# Patient Record
Sex: Female | Born: 1995 | Race: Black or African American | Hispanic: No | Marital: Single | Smoking: Never smoker
Health system: Southern US, Community
[De-identification: ages and names within clinical notes are randomized; demographics above are authoritative.]

---
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
## 2019-04-06 ENCOUNTER — Encounter (HOSPITAL_COMMUNITY): Payer: Self-pay | Admitting: *Deleted

## 2019-04-06 ENCOUNTER — Other Ambulatory Visit: Payer: Self-pay

## 2019-04-06 ENCOUNTER — Emergency Department (HOSPITAL_COMMUNITY)
Admission: EM | Admit: 2019-04-06 | Discharge: 2019-04-06 | Disposition: A | Discharge status: Home / Self Care | Payer: Self-pay | Attending: Emergency Medicine | Admitting: Emergency Medicine

## 2019-04-06 DIAGNOSIS — Z4801 Encounter for change or removal of surgical wound dressing: Secondary | ICD-10-CM | POA: Insufficient documentation

## 2019-04-06 DIAGNOSIS — Z4800 Encounter for change or removal of nonsurgical wound dressing: Secondary | ICD-10-CM

## 2019-04-06 DIAGNOSIS — Z5189 Encounter for other specified aftercare: Secondary | ICD-10-CM

## 2019-04-06 DIAGNOSIS — N764 Abscess of vulva: Secondary | ICD-10-CM | POA: Insufficient documentation

## 2019-04-06 NOTE — ED Triage Notes (Signed)
Pt in requesting L sided suprapubic abcess packing removed and dressing placed, per pt the packing was placed 04/04/19, denies fever, pt ambulatory, A&O x4

## 2019-04-06 NOTE — ED Provider Notes (Signed)
[facility] EMERGENCY DEPARTMENT Provider Note   CSN: [id number] Arrival date & time: 04/06/19  1323    History   Chief Complaint Chief Complaint  Patient presents with  . Dressing Change    HPI Dannisha Threadgill is a 23 y.o. female.     HPI   23 year old female presents emergency department today for wound check and packing removal.  States she was in [place] last week and had left labial abscess incised and drained.  States she had packing placed.  This occurred on 04/04/19.  At that time she was also having fevers.  She has been on clindamycin.  States that symptoms have improved.  She is no longer having fevers.  The drainage has slowed.  The swelling has also improved.  History reviewed. No pertinent past medical history.  Patient Active Problem List   Diagnosis Date Noted  . ECZEMA, ATOPIC DERMATITIS 01/29/2007    History reviewed. No pertinent surgical history.   OB History   No obstetric history on file.      Home Medications    Prior to Admission medications   Not on File    Family History No family history on file.  Social History Social History   Tobacco Use  . Smoking status: Never Smoker  . Smokeless tobacco: Never Used  Substance Use Topics  . Alcohol use: Not Currently  . Drug use: Not Currently     Allergies   Patient has no known allergies.   Review of Systems Review of Systems  Constitutional: Negative for fever.  Genitourinary:       Left labial abscess  Skin: Positive for wound.     Physical Exam Updated Vital Signs BP 115/74 (BP Location: Right Arm)   Pulse 88   Temp 98.4 F (36.9 C) (Oral)   Resp 14   Ht 5\' 4"  (1.626 m)   Wt 120.2 kg   SpO2 100%   BMI 45.49 kg/m   Physical Exam Vitals signs and nursing note reviewed.  Constitutional:      General: She is not in acute distress.    Appearance: She is well-developed.  HENT:     Head: Normocephalic and atraumatic.  Eyes:     Conjunctiva/sclera:  Conjunctivae normal.  Neck:     Musculoskeletal: Neck supple.  Cardiovascular:     Rate and Rhythm: Normal rate.  Pulmonary:     Effort: Pulmonary effort is normal.  Genitourinary:    Comments: Chaperone present during GU exam. 2x3cm area of induration to the left upper labia. Mildly ttp. Wound packing place.  Musculoskeletal: Normal range of motion.  Skin:    General: Skin is warm and dry.  Neurological:     Mental Status: She is alert.      ED Treatments / Results  Labs (all labs ordered are listed, but only abnormal results are displayed) Labs Reviewed - No data to display  EKG None  Radiology No results found.  Procedures .Foreign Body Removal Date/Time: 04/06/2019 1:58 PM Performed by: Karrie Meres, PA-C Authorized by: Karrie Meres, PA-C  Consent: Verbal consent obtained. Consent given by: patient Patient understanding: patient states understanding of the procedure being performed Patient consent: the patient's understanding of the procedure matches consent given Procedure consent: procedure consent matches procedure scheduled Imaging studies: imaging studies available Patient identity confirmed: verbally with patient Body area: vagina (left labia)  Sedation: Patient sedated: no  Patient restrained: no Localization method: visualized Removal mechanism: digital extraction Complexity: simple  1 objects recovered. Objects recovered: iodoform gauze Post-procedure assessment: foreign body removed Patient tolerance: Patient tolerated the procedure well with no immediate complications   (including critical care time)  Medications Ordered in ED Medications - No data to display   Initial Impression / Assessment and Plan / ED Course  I have reviewed the triage vital signs and the nursing notes.  Pertinent labs & imaging results that were available during my care of the patient were reviewed by me and considered in my medical decision making (see chart  for details).        Final Clinical Impressions(s) / ED Diagnoses   Final diagnoses:  Visit for wound check  Abscess packing removal   Pt with abscess seen two days ago with I&D. This occurred in [place] and chart not available for review. Pt has been doing a very good job of taking care of the site at home and has been taking medications as directed. The site is healthy appearing, with only a small amount of purulent drainage around the skin but incision is open and without significant drainage. Mild surrounding cellulitis and no palpable fluctuance indicating deeper infection or residual abscess. Pt has minimal tenderness in the area. Discussed home care and return precautions, pt advised to continue all antibiotics until they are gone. Will give gen surg referral as she notes recurrent abscesses that have required surgical drainage in the past and just moved to the area so she is not established here. Will give pcp f/u as well. advised to return to the ED for new or worsening sxs in the meantime. She voices understanding and is in agreement with the plan. All questions answered and pt stable for discharge.    ED Discharge Orders    None       Rayne DuCouture, Raesean Bartoletti S, PA-C 04/06/19 1404    Gwyneth SproutPlunkett, Whitney, MD 04/06/19 1553

## 2019-04-06 NOTE — Discharge Instructions (Signed)
Continue taking your antibiotic until the course is finished.   Please complete warm soaks in the tub 2-3 times per day.  Please follow up with your primary care provider within 5-7 days for re-evaluation of your symptoms. If you do not have a primary care provider, information for a healthcare clinic has been provided for you to make arrangements for follow up care. Please return to the emergency department for any new or worsening symptoms.

## 2020-03-11 ENCOUNTER — Encounter (HOSPITAL_COMMUNITY): Payer: Self-pay | Admitting: Emergency Medicine

## 2020-03-11 ENCOUNTER — Emergency Department (HOSPITAL_COMMUNITY)
Admission: EM | Admit: 2020-03-11 | Discharge: 2020-03-11 | Disposition: A | Discharge status: Home / Self Care | Payer: Self-pay | Attending: Emergency Medicine | Admitting: Emergency Medicine

## 2020-03-11 ENCOUNTER — Other Ambulatory Visit: Payer: Self-pay

## 2020-03-11 DIAGNOSIS — B373 Candidiasis of vulva and vagina: Secondary | ICD-10-CM | POA: Insufficient documentation

## 2020-03-11 DIAGNOSIS — A599 Trichomoniasis, unspecified: Secondary | ICD-10-CM | POA: Insufficient documentation

## 2020-03-11 DIAGNOSIS — Z202 Contact with and (suspected) exposure to infections with a predominantly sexual mode of transmission: Secondary | ICD-10-CM | POA: Insufficient documentation

## 2020-03-11 DIAGNOSIS — B3731 Acute candidiasis of vulva and vagina: Secondary | ICD-10-CM

## 2020-03-11 LAB — HIV ANTIBODY (ROUTINE TESTING W REFLEX): HIV Screen 4th Generation wRfx: NONREACTIVE

## 2020-03-11 LAB — URINALYSIS, ROUTINE W REFLEX MICROSCOPIC
Bilirubin Urine: NEGATIVE
Glucose, UA: NEGATIVE mg/dL
Ketones, ur: 20 mg/dL — AB
Nitrite: NEGATIVE
Protein, ur: 30 mg/dL — AB
RBC / HPF: >50 RBC/hpf — ABNORMAL HIGH (ref 0–5)
Specific Gravity, Urine: 1.025 (ref 1.005–1.030)
pH: 6 (ref 5.0–8.0)

## 2020-03-11 LAB — WET PREP, GENITAL
Clue Cells Wet Prep HPF POC: NONE SEEN
Sperm: NONE SEEN

## 2020-03-11 LAB — I-STAT BETA HCG BLOOD, ED (MC, WL, AP ONLY): I-stat hCG, quantitative: <5 m[IU]/mL (ref <5)

## 2020-03-11 MED ORDER — FLUCONAZOLE 150 MG PO TABS
150.0000 mg | ORAL_TABLET | Freq: Once | ORAL | Status: AC
  Administered 2020-03-11: 150 mg via ORAL
  Filled 2020-03-11: qty 1

## 2020-03-11 MED ORDER — CEFTRIAXONE SODIUM 500 MG IJ SOLR
500.0000 mg | Freq: Once | INTRAMUSCULAR | Status: AC
  Administered 2020-03-11: 500 mg via INTRAMUSCULAR
  Filled 2020-03-11: qty 500

## 2020-03-11 MED ORDER — METRONIDAZOLE 500 MG PO TABS
500.0000 mg | ORAL_TABLET | Freq: Once | ORAL | Status: AC
  Administered 2020-03-11: 500 mg via ORAL
  Filled 2020-03-11: qty 1

## 2020-03-11 MED ORDER — DOXYCYCLINE HYCLATE 100 MG PO TABS
100.0000 mg | ORAL_TABLET | Freq: Once | ORAL | Status: AC
  Administered 2020-03-11: 21:00:00 100 mg via ORAL
  Filled 2020-03-11: qty 1

## 2020-03-11 MED ORDER — DOXYCYCLINE HYCLATE 100 MG PO CAPS: 100.0000 mg | ORAL_CAPSULE | Freq: Two times a day (BID) | ORAL | 0 refills | Status: AC

## 2020-03-11 MED ORDER — METRONIDAZOLE 500 MG PO TABS: 500.0000 mg | ORAL_TABLET | Freq: Two times a day (BID) | ORAL | 0 refills | Status: AC

## 2020-03-11 NOTE — ED Triage Notes (Signed)
C/o vaginal discharge and vaginal discomfort x 2-3 days.

## 2020-03-11 NOTE — ED Notes (Signed)
Pelvic cart at bedside. 

## 2020-03-11 NOTE — ED Notes (Signed)
Patient verbalizes understanding of discharge instructions. Opportunity for questioning and answers were provided. Armband removed by staff, pt discharged from ED. Pt. ambulatory and discharged home.  

## 2020-03-11 NOTE — ED Provider Notes (Signed)
[facility] EMERGENCY DEPARTMENT Provider Note   CSN: [id number] Arrival date & time: 03/11/20  1714     History Chief Complaint  Patient presents with   Vaginal Discharge    Katelyn Parker is a 24 y.o. female history of asthma and obesity.  Patient presents today for vaginal discharge and discomfort x3 days.  She reports yellow discharge intermittently beginning 3 days ago, unchanged in amount since onset.  She describes discomfort as a itching sensation to her vagina mild constant nonradiating no clear aggravating or alleviating factors.  She reports history of similar in the past when she was diagnosed with bacterial vaginosis.  She reports that she is sexually active and only uses protection intermittently.  She would like testing and treatment for STDs today.  Denies fever/chills, chest pain, cough/shortness of breath, abdominal pain, nausea/vomiting, dysuria/hematuria, rash/lesions, diarrhea or additional concerns.  HPI     History reviewed. No pertinent past medical history.  Patient Active Problem List   Diagnosis Date Noted   ECZEMA, ATOPIC DERMATITIS 01/29/2007    History reviewed. No pertinent surgical history.   OB History   No obstetric history on file.     No family history on file.  Social History   Tobacco Use   Smoking status: Never Smoker   Smokeless tobacco: Never Used  Substance Use Topics   Alcohol use: Not Currently   Drug use: Not Currently    Home Medications Prior to Admission medications   Medication Sig Start Date End Date Taking? Authorizing Provider  doxycycline (VIBRAMYCIN) 100 MG capsule Take 1 capsule (100 mg total) by mouth 2 (two) times daily for 7 days. 03/11/20 03/18/20  Harlene Salts A, PA-C  metroNIDAZOLE (FLAGYL) 500 MG tablet Take 1 tablet (500 mg total) by mouth 2 (two) times daily for 7 days. 03/11/20 03/18/20  Bill Salinas, PA-C    Allergies    Patient has no known allergies.  Review of  Systems   Review of Systems Ten systems are reviewed and are negative for acute change except as noted in the HPI  Physical Exam Updated Vital Signs BP 129/90 (BP Location: Right Arm)    Pulse 73    Temp 98.3 F (36.8 C) (Oral)    Resp 14    Ht 5' 3.75" (1.619 m)    Wt 120.7 kg    SpO2 98%    BMI 46.02 kg/m   Physical Exam Constitutional:      General: She is not in acute distress.    Appearance: Normal appearance. She is well-developed. She is obese. She is not ill-appearing or diaphoretic.  HENT:     Head: Normocephalic and atraumatic.     Right Ear: External ear normal.     Left Ear: External ear normal.     Nose: Nose normal.  Eyes:     General: Vision grossly intact. Gaze aligned appropriately.     Pupils: Pupils are equal, round, and reactive to light.  Neck:     Trachea: Trachea and phonation normal. No tracheal deviation.  Pulmonary:     Effort: Pulmonary effort is normal. No respiratory distress.  Abdominal:     General: There is no distension.     Palpations: Abdomen is soft.     Tenderness: There is no abdominal tenderness. There is no guarding or rebound.  Genitourinary:    Comments: Exam chaperoned by Ricard Dillon.  Pelvic exam: normal external genitalia without evidence of trauma. VULVA: normal appearing vulva with no  masses, tenderness or lesion. VAGINA: normal appearing vagina with normal color and discharge, no lesions. CERVIX: normal appearing cervix without lesions, cervical motion tenderness absent, cervical os closed without purulent discharge; vaginal discharge yellow. Wet prep and DNA probe for chlamydia and GC obtained.  ADNEXA: normal adnexa in size, nontender and no masses UTERUS: uterus is normal size, shape, consistency and nontender.  Musculoskeletal:        General: Normal range of motion.     Cervical back: Normal range of motion.  Skin:    General: Skin is warm and dry.  Neurological:     Mental Status: She is alert.     GCS: GCS eye  subscore is 4. GCS verbal subscore is 5. GCS motor subscore is 6.     Comments: Speech is clear and goal oriented, follows commands Major Cranial nerves without deficit, no facial droop Moves extremities without ataxia, coordination intact  Psychiatric:        Behavior: Behavior normal.     ED Results / Procedures / Treatments   Labs (all labs ordered are listed, but only abnormal results are displayed) Labs Reviewed  WET PREP, GENITAL - Abnormal; Notable for the following components:      Result Value   Yeast Wet Prep HPF POC PRESENT (*)    Trich, Wet Prep PRESENT (*)    WBC, Wet Prep HPF POC MANY (*)    All other components within normal limits  URINALYSIS, ROUTINE W REFLEX MICROSCOPIC - Abnormal; Notable for the following components:   APPearance CLOUDY (*)    Hgb urine dipstick MODERATE (*)    Ketones, ur 20 (*)    Protein, ur 30 (*)    Leukocytes,Ua LARGE (*)    RBC / HPF >50 (*)    Bacteria, UA RARE (*)    All other components within normal limits  HIV ANTIBODY (ROUTINE TESTING W REFLEX)  RPR  I-STAT BETA HCG BLOOD, ED ([facility], [facility], [facility] ONLY)  GC/CHLAMYDIA PROBE AMP ([place]) NOT AT [facility]    EKG None  Radiology No results found.  Procedures Procedures (including critical care time)  Medications Ordered in ED Medications  cefTRIAXone (ROCEPHIN) injection 500 mg (500 mg Intramuscular Given 03/11/20 2049)  metroNIDAZOLE (FLAGYL) tablet 500 mg (500 mg Oral Given 03/11/20 2049)  doxycycline (VIBRA-TABS) tablet 100 mg (100 mg Oral Given 03/11/20 2049)  fluconazole (DIFLUCAN) tablet 150 mg (150 mg Oral Given 03/11/20 2049)    ED Course  I have reviewed the triage vital signs and the nursing notes.  Pertinent labs & imaging results that were available during my care of the patient were reviewed by me and considered in my medical decision making (see chart for details).  Clinical Course as of Mar 12 20  Sat Mar 11, 2020  2003 Elizabeth RN   [BM]    Clinical Course  User Index [BM] Gari Crown   MDM Rules/Calculators/A&P                      24 year old female presented today for concern of vaginal discharge and STD.  She reports that she is sexually active and only intermittently uses protection.  She denies any systemic symptoms such as fever and has no pain or additional concerns today.  Pelvic examination shows yellow vaginal discharge, no tenderness on exam, and no cervical motion tenderness.  I ordered reviewed and interpreted the following labs.  Negative pregnancy test.  Urinalysis with ketones, protein, large leukocytes,  50 RBCs, 21-50 WBCs, rare bacteria, 6/10 squamous cells.  Urinalysis is grossly contaminated and patient appears dehydrated.  Patient has no urinary symptoms doubt UTI suspect abnormalities secondary to dirty catch and STI.  Additionally patient has no systemic symptoms or history of diabetes to suggest emergent pathologies such as DKA requiring blood work today.  Wet prep shows yeast, trichomonas and WBCs.  HIV nonreactive.  GC chlamydia and RPR are pending. - Based on patient's presenting symptoms, exam and laboratory work-up above concern for likely STI gonorrhea and chlamydia.  Patient is requesting treatment, she will be treated empirically with 500 mg IM Rocephin and 1 week of doxycycline 100 mg twice daily.  Additionally will start patient on Flagyl 500 mg twice daily x7 days for treatment of trichomoniasis.  Patient given 150 mg fluconazole for treatment of vaginal yeast infection.  There is no evidence of PID, ovarian torsion, ectopic pregnancy or other emergent pathologies at this time requiring further work-up in the ER. Patient has been advised to not drink alcohol while on this medication. Patient to be discharged with instructions to follow up with OBGYN/PCP. Discussed importance of using protection when sexually active.  Patient to abstain from sexual activity for at least 2 weeks and then to use protection  thereafter with every sexual encounter.  At this time there does not appear to be any evidence of an acute emergency medical condition and the patient appears stable for discharge with appropriate outpatient follow up. Diagnosis was discussed with patient who verbalizes understanding of care plan and is agreeable to discharge. I have discussed return precautions with patient who verbalizes understanding of return precautions. Patient encouraged to follow-up with their PCP. All questions answered.   Note: Portions of this report may have been transcribed using voice recognition software. Every effort was made to ensure accuracy; however, inadvertent computerized transcription errors may still be present. Final Clinical Impression(s) / ED Diagnoses Final diagnoses:  Vaginal yeast infection  Trichomoniasis  Exposure to sexually transmitted disease (STD)    Rx / DC Orders ED Discharge Orders         Ordered    doxycycline (VIBRAMYCIN) 100 MG capsule  2 times daily     03/11/20 2135    metroNIDAZOLE (FLAGYL) 500 MG tablet  2 times daily     03/11/20 2135           Elizabeth Palau 03/12/20 0027    Charlynne Pander, MD 03/16/20 1615

## 2020-03-11 NOTE — Discharge Instructions (Signed)
You have been diagnosed today with exposure to STD, vaginal yeast infection, trichomoniasis.  At this time there does not appear to be the presence of an emergent medical condition, however there is always the potential for conditions to change. Please read and follow the below instructions.  Please return to the Emergency Department immediately for any new or worsening symptoms. Please be sure to follow up with your Primary Care Provider within one week regarding your visit today; please call their office to schedule an appointment even if you are feeling better for a follow-up visit. You have been started on treatment for gonorrhea, chlamydia, vaginal yeast infection and trichomoniasis.  Please continue taking the medications doxycycline and Flagyl as prescribed until complete for treatment of infection.  Do not drink alcohol while taking Flagyl as it will make you sick and vomit.  You have been treated presumptively today for gonorrhea and chlamydia.  You have been tested today for gonorrhea and chlamydia as well as HIV and syphilis. These results will be available in approximately 3 days. You may check your MyChart account for results. Please inform all sexual partners of positive results and that they should be tested and treated as well. Please wait 2 weeks and be sure that you and your partners are symptom free before returning to sexual activity. Please use protection with every sexual encounter. Follow Up: Please followup with your primary doctor in 3 days for discussion of your diagnoses and further evaluation after today's visit; if you do not have a primary care doctor use the resource guide provided to find one; Please return to the ER for worsening symptoms, high fevers or persistent vomiting.  Get Help Right Away If: You have a fever. Your symptoms go away and then return. Your symptoms do not get better with treatment. Your symptoms get worse. You have new symptoms. You develop blisters  in or around your vagina. You have blood coming from your vagina and it is not your menstrual period. You develop pain in your abdomen. You have any new/concerning or worsening of symptoms  Please read the additional information packets attached to your discharge summary.  Do not take your medicine if  develop an itchy rash, swelling in your mouth or lips, or difficulty breathing; call 911 and seek immediate emergency medical attention if this occurs.  Note: Portions of this text may have been transcribed using voice recognition software. Every effort was made to ensure accuracy; however, inadvertent computerized transcription errors may still be present.

## 2020-03-12 LAB — RPR: RPR Ser Ql: NONREACTIVE

## 2020-03-13 LAB — GC/CHLAMYDIA PROBE AMP (~~LOC~~) NOT AT ARMC
Chlamydia: NEGATIVE
Comment: NEGATIVE
Comment: NORMAL
Neisseria Gonorrhea: NEGATIVE

## 2021-03-03 ENCOUNTER — Emergency Department (HOSPITAL_COMMUNITY)
Admission: EM | Admit: 2021-03-03 | Discharge: 2021-03-03 | Disposition: A | Discharge status: Home / Self Care | Payer: Self-pay | Attending: Emergency Medicine | Admitting: Emergency Medicine

## 2021-03-03 ENCOUNTER — Encounter (HOSPITAL_COMMUNITY): Payer: Self-pay | Admitting: *Deleted

## 2021-03-03 ENCOUNTER — Other Ambulatory Visit: Payer: Self-pay

## 2021-03-03 ENCOUNTER — Emergency Department (HOSPITAL_COMMUNITY): Payer: Self-pay

## 2021-03-03 DIAGNOSIS — H1032 Unspecified acute conjunctivitis, left eye: Secondary | ICD-10-CM | POA: Insufficient documentation

## 2021-03-03 DIAGNOSIS — J039 Acute tonsillitis, unspecified: Secondary | ICD-10-CM | POA: Insufficient documentation

## 2021-03-03 DIAGNOSIS — R0981 Nasal congestion: Secondary | ICD-10-CM | POA: Insufficient documentation

## 2021-03-03 DIAGNOSIS — F17200 Nicotine dependence, unspecified, uncomplicated: Secondary | ICD-10-CM | POA: Insufficient documentation

## 2021-03-03 DIAGNOSIS — R059 Cough, unspecified: Secondary | ICD-10-CM | POA: Insufficient documentation

## 2021-03-03 LAB — GROUP A STREP BY PCR: Group A Strep by PCR: NOT DETECTED

## 2021-03-03 MED ORDER — ACETAMINOPHEN 325 MG PO TABS
650.0000 mg | ORAL_TABLET | Freq: Once | ORAL | Status: AC | PRN
  Administered 2021-03-03: 650 mg via ORAL
  Filled 2021-03-03: qty 2

## 2021-03-03 MED ORDER — ERYTHROMYCIN 5 MG/GM OP OINT: TOPICAL_OINTMENT | OPHTHALMIC | 0 refills | Status: AC

## 2021-03-03 NOTE — Discharge Instructions (Addendum)
Use topical antibiotics as directed in left eye. Your strep was negative.  For cough you can use tea and honey.

## 2021-03-03 NOTE — ED Triage Notes (Signed)
C/o headache , sorethroat , and congestion x several days.

## 2021-03-03 NOTE — ED Provider Notes (Signed)
MSE was initiated and I personally evaluated the patient and placed orders (if any) at  5:45 AM on March 03, 2021.  C/o headache , sorethroat , and congestion x several days.  Lots of pollen exposure.  Left conjunctivitis.  Discussed with patient that their care has been initiated.   They are counseled that they will need to remain in the ED until the completion of their workup, including full H&P and results of any tests.  Risks of leaving the emergency department prior to completion of treatment were discussed. Patient was advised to inform ED staff if they are leaving before their treatment is complete. The patient acknowledged these risks and time was allowed for questions.    The patient appears stable so that the remainder of the MSE may be completed by another provider.    Roxy Horseman, PA-C 03/03/21 8115    Zadie Rhine, MD 03/03/21 [phone]

## 2021-03-03 NOTE — ED Provider Notes (Signed)
[facility] EMERGENCY DEPARTMENT Provider Note   CSN: [id number] Arrival date & time: 03/03/21  8185     History Chief Complaint  Patient presents with  . Sore Throat    Katelyn Parker is a 25 y.o. female.  Patient with history of eczema presents with cough, congestion and left eye drainage gradually worsening throughout the week.  Patient was exposed to nephew with similar symptoms early on.  Patient does have productive cough as well.  Patient is sore throat with coughing past few days.  Patient crusting of the left eye this morning.        History reviewed. No pertinent past medical history.  Patient Active Problem List   Diagnosis Date Noted  . ECZEMA, ATOPIC DERMATITIS 01/29/2007    History reviewed. No pertinent surgical history.   OB History   No obstetric history on file.     No family history on file.  Social History   Tobacco Use  . Smoking status: Current Some Day Smoker  . Smokeless tobacco: Never Used  Substance Use Topics  . Alcohol use: Yes  . Drug use: Not Currently    Home Medications Prior to Admission medications   Medication Sig Start Date End Date Taking? Authorizing Provider  erythromycin ophthalmic ointment Place a 1/2 inch ribbon of ointment into the lower eyelid three times daily. 03/03/21 03/09/21 Yes Blane Ohara, MD    Allergies    Patient has no known allergies.  Review of Systems   Review of Systems  Constitutional: Negative for chills and fever.  HENT: Positive for congestion.   Eyes: Positive for redness and itching. Negative for visual disturbance.  Respiratory: Positive for cough. Negative for shortness of breath.   Cardiovascular: Negative for chest pain.  Gastrointestinal: Negative for abdominal pain and vomiting.  Genitourinary: Negative for dysuria and flank pain.  Musculoskeletal: Negative for back pain, neck pain and neck stiffness.  Skin: Negative for rash.  Neurological: Negative for  light-headedness and headaches.    Physical Exam Updated Vital Signs BP 132/85 (BP Location: Left Arm)   Pulse 80   Temp 98.2 F (36.8 C) (Oral)   Resp 15   Ht 5\' 4"  (1.626 m)   Wt 116.1 kg   SpO2 97%   BMI 43.94 kg/m   Physical Exam Vitals and nursing note reviewed.  Constitutional:      Appearance: She is well-developed.  HENT:     Head: Normocephalic and atraumatic.     Comments: Mild crusting left eye, conjunctival injection, no periorbital edema, no pain with full EOMF    Nose: Congestion and rhinorrhea present.     Mouth/Throat:     Mouth: Mucous membranes are moist.     Tonsils: No tonsillar exudate or tonsillar abscesses. 3+ on the right. 3+ on the left.  Eyes:     General:        Right eye: No discharge.        Left eye: No discharge.     Conjunctiva/sclera: Conjunctivae normal.  Neck:     Trachea: No tracheal deviation.  Cardiovascular:     Rate and Rhythm: Normal rate and regular rhythm.  Pulmonary:     Effort: Pulmonary effort is normal.     Breath sounds: Normal breath sounds.  Abdominal:     General: There is no distension.     Palpations: Abdomen is soft.     Tenderness: There is no abdominal tenderness. There is no guarding.  Musculoskeletal:  Cervical back: Normal range of motion and neck supple.  Skin:    General: Skin is warm.     Findings: No rash.  Neurological:     Mental Status: She is alert and oriented to person, place, and time.     ED Results / Procedures / Treatments   Labs (all labs ordered are listed, but only abnormal results are displayed) Labs Reviewed  GROUP A STREP BY PCR    EKG None  Radiology DG Chest 2 View  Result Date: 03/03/2021 CLINICAL DATA:  Cough EXAM: CHEST - 2 VIEW COMPARISON:  None. FINDINGS: Normal heart size and mediastinal contours. No acute infiltrate or edema. No effusion or pneumothorax. No acute osseous findings. IMPRESSION: Negative for pneumonia. Electronically Signed   By: Marnee Spring  M.D.   On: 03/03/2021 06:21    Procedures Procedures   Medications Ordered in ED Medications  acetaminophen (TYLENOL) tablet 650 mg (650 mg Oral Given 03/03/21 1034)    ED Course  I have reviewed the triage vital signs and the nursing notes.  Pertinent labs & imaging results that were available during my care of the patient were reviewed by me and considered in my medical decision making (see chart for details).    MDM Rules/Calculators/A&P                          Patient presents with worsening respiratory symptoms.  Concern for conjunctivitis possibly bacterial with drainage in addition viral respiratory infection.  Patient moving air well, lungs are clear, chest x-ray no infiltrate reviewed.  Strep test sent and discussed follow-up of results if positive will need amoxicillin.  Supportive care discussed. Final Clinical Impression(s) / ED Diagnoses Final diagnoses:  Acute conjunctivitis of left eye, unspecified acute conjunctivitis type  Tonsillitis  Cough    Rx / DC Orders ED Discharge Orders         Ordered    erythromycin ophthalmic ointment        03/03/21 1114           Blane Ohara, MD 03/03/21 1117
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
## 2021-03-13 ENCOUNTER — Ambulatory Visit (INDEPENDENT_AMBULATORY_CARE_PROVIDER_SITE_OTHER): Payer: Self-pay | Admitting: Primary Care

## 2021-06-05 ENCOUNTER — Emergency Department (HOSPITAL_COMMUNITY)
Admission: EM | Admit: 2021-06-05 | Discharge: 2021-06-05 | Disposition: A | Discharge status: Home / Self Care | Payer: 59 | Attending: Emergency Medicine | Admitting: Emergency Medicine

## 2021-06-05 ENCOUNTER — Encounter (HOSPITAL_COMMUNITY): Payer: Self-pay

## 2021-06-05 DIAGNOSIS — R531 Weakness: Secondary | ICD-10-CM | POA: Diagnosis not present

## 2021-06-05 DIAGNOSIS — F10129 Alcohol abuse with intoxication, unspecified: Secondary | ICD-10-CM | POA: Diagnosis not present

## 2021-06-05 DIAGNOSIS — F17200 Nicotine dependence, unspecified, uncomplicated: Secondary | ICD-10-CM | POA: Diagnosis not present

## 2021-06-05 DIAGNOSIS — R0789 Other chest pain: Secondary | ICD-10-CM | POA: Insufficient documentation

## 2021-06-05 DIAGNOSIS — R109 Unspecified abdominal pain: Secondary | ICD-10-CM | POA: Diagnosis not present

## 2021-06-05 DIAGNOSIS — R000 Tachycardia, unspecified: Secondary | ICD-10-CM | POA: Insufficient documentation

## 2021-06-05 DIAGNOSIS — E860 Dehydration: Secondary | ICD-10-CM | POA: Insufficient documentation

## 2021-06-05 DIAGNOSIS — R55 Syncope and collapse: Secondary | ICD-10-CM

## 2021-06-05 LAB — URINALYSIS, ROUTINE W REFLEX MICROSCOPIC
Bilirubin Urine: NEGATIVE
Glucose, UA: NEGATIVE mg/dL
Ketones, ur: NEGATIVE mg/dL
Nitrite: NEGATIVE
Protein, ur: NEGATIVE mg/dL
Specific Gravity, Urine: 1.003 — ABNORMAL LOW (ref 1.005–1.030)
pH: 6 (ref 5.0–8.0)

## 2021-06-05 LAB — COMPREHENSIVE METABOLIC PANEL
ALT: 11 U/L (ref 0–44)
AST: 22 U/L (ref 15–41)
Albumin: 3.4 g/dL — ABNORMAL LOW (ref 3.5–5.0)
Alkaline Phosphatase: 63 U/L (ref 38–126)
Anion gap: 10 (ref 5–15)
BUN: 11 mg/dL (ref 6–20)
CO2: 22 mmol/L (ref 22–32)
Calcium: 8.8 mg/dL — ABNORMAL LOW (ref 8.9–10.3)
Chloride: 105 mmol/L (ref 98–111)
Creatinine, Ser: 0.96 mg/dL (ref 0.44–1.00)
GFR, Estimated: >60 mL/min (ref >60)
Glucose, Bld: 132 mg/dL — ABNORMAL HIGH (ref 70–99)
Potassium: 3.5 mmol/L (ref 3.5–5.1)
Sodium: 137 mmol/L (ref 135–145)
Total Bilirubin: 0.5 mg/dL (ref 0.3–1.2)
Total Protein: 6.4 g/dL — ABNORMAL LOW (ref 6.5–8.1)

## 2021-06-05 LAB — I-STAT BETA HCG BLOOD, ED (MC, WL, AP ONLY): I-stat hCG, quantitative: <5 m[IU]/mL (ref <5)

## 2021-06-05 LAB — CBC
HCT: 42.3 % (ref 36.0–46.0)
Hemoglobin: 13.5 g/dL (ref 12.0–15.0)
MCH: 27.9 pg (ref 26.0–34.0)
MCHC: 31.9 g/dL (ref 30.0–36.0)
MCV: 87.4 fL (ref 80.0–100.0)
Platelets: 340 10*3/uL (ref 150–400)
RBC: 4.84 MIL/uL (ref 3.87–5.11)
RDW: 14.3 % (ref 11.5–15.5)
WBC: 7.1 10*3/uL (ref 4.0–10.5)
nRBC: 0 % (ref 0.0–0.2)

## 2021-06-05 LAB — LIPASE, BLOOD: Lipase: 24 U/L (ref 11–51)

## 2021-06-05 MED ORDER — LACTATED RINGERS IV BOLUS
1000.0000 mL | Freq: Once | INTRAVENOUS | Status: AC
  Administered 2021-06-05: 1000 mL via INTRAVENOUS

## 2021-06-05 MED ORDER — ONDANSETRON 4 MG PO TBDP: 4.0000 mg | ORAL_TABLET | Freq: Three times a day (TID) | ORAL | 0 refills | Status: DC | PRN

## 2021-06-05 NOTE — ED Notes (Signed)
Pt given water. Informed pt if she becomes nauseated to inform staff.

## 2021-06-05 NOTE — ED Notes (Signed)
She walked to the restroom without issues, we will start a PO challenge at this time.

## 2021-06-05 NOTE — ED Notes (Signed)
Pt ambulated on steady gait without assistance

## 2021-06-05 NOTE — ED Notes (Signed)
ED Provider at bedside. 
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
## 2021-07-19 ENCOUNTER — Emergency Department (HOSPITAL_COMMUNITY)
Admission: EM | Admit: 2021-07-19 | Discharge: 2021-07-19 | Disposition: A | Discharge status: Home / Self Care | Payer: 59 | Attending: Emergency Medicine | Admitting: Emergency Medicine

## 2021-07-19 ENCOUNTER — Emergency Department (HOSPITAL_COMMUNITY): Payer: 59

## 2021-07-19 ENCOUNTER — Encounter (HOSPITAL_COMMUNITY): Payer: Self-pay | Admitting: Emergency Medicine

## 2021-07-19 ENCOUNTER — Other Ambulatory Visit: Payer: Self-pay

## 2021-07-19 DIAGNOSIS — F17200 Nicotine dependence, unspecified, uncomplicated: Secondary | ICD-10-CM | POA: Diagnosis not present

## 2021-07-19 DIAGNOSIS — N898 Other specified noninflammatory disorders of vagina: Secondary | ICD-10-CM

## 2021-07-19 LAB — URINALYSIS, ROUTINE W REFLEX MICROSCOPIC
Bilirubin Urine: NEGATIVE
Glucose, UA: NEGATIVE mg/dL
Hgb urine dipstick: NEGATIVE
Ketones, ur: NEGATIVE mg/dL
Nitrite: NEGATIVE
Protein, ur: NEGATIVE mg/dL
Specific Gravity, Urine: 1.021 (ref 1.005–1.030)
pH: 6 (ref 5.0–8.0)

## 2021-07-19 LAB — WET PREP, GENITAL
Sperm: NONE SEEN
Yeast Wet Prep HPF POC: NONE SEEN

## 2021-07-19 LAB — PREGNANCY, URINE: Preg Test, Ur: NEGATIVE

## 2021-07-19 MED ORDER — AZITHROMYCIN 1 G PO PACK
1.0000 g | PACK | Freq: Once | ORAL | Status: AC
  Administered 2021-07-19: 1 g via ORAL
  Filled 2021-07-19: qty 1

## 2021-07-19 MED ORDER — IPRATROPIUM-ALBUTEROL 0.5-2.5 (3) MG/3ML IN SOLN
3.0000 mL | Freq: Once | RESPIRATORY_TRACT | Status: DC
  Filled 2021-07-19: qty 3

## 2021-07-19 MED ORDER — FLUCONAZOLE 150 MG PO TABS: 150.0000 mg | ORAL_TABLET | Freq: Every day | ORAL | 0 refills | Status: DC

## 2021-07-19 MED ORDER — LIDOCAINE HCL (PF) 1 % IJ SOLN
INTRAMUSCULAR | Status: AC
  Administered 2021-07-19: 5 mL
  Filled 2021-07-19: qty 5

## 2021-07-19 MED ORDER — METRONIDAZOLE 500 MG PO TABS: 500.0000 mg | ORAL_TABLET | Freq: Two times a day (BID) | ORAL | 0 refills | Status: DC

## 2021-07-19 MED ORDER — CEFTRIAXONE SODIUM 500 MG IJ SOLR
500.0000 mg | Freq: Once | INTRAMUSCULAR | Status: AC
  Administered 2021-07-19: 500 mg via INTRAMUSCULAR
  Filled 2021-07-19: qty 500
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
## 2021-07-19 NOTE — Discharge Instructions (Addendum)
Return for any problem.  ?

## 2021-07-20 LAB — GC/CHLAMYDIA PROBE AMP (~~LOC~~) NOT AT ARMC
Chlamydia: POSITIVE — AB
Comment: NEGATIVE
Comment: NORMAL
Neisseria Gonorrhea: NEGATIVE

## 2021-07-23 ENCOUNTER — Ambulatory Visit (INDEPENDENT_AMBULATORY_CARE_PROVIDER_SITE_OTHER): Payer: Self-pay | Admitting: Nurse Practitioner

## 2021-07-23 ENCOUNTER — Encounter: Payer: Self-pay | Admitting: Nurse Practitioner

## 2021-07-23 ENCOUNTER — Other Ambulatory Visit: Payer: Self-pay

## 2021-07-23 VITALS — BP 128/87 | HR 71 | Wt 257.2 lb

## 2021-07-23 DIAGNOSIS — Z6841 Body Mass Index (BMI) 40.0 and over, adult: Secondary | ICD-10-CM

## 2021-07-23 DIAGNOSIS — Z720 Tobacco use: Secondary | ICD-10-CM

## 2021-07-23 DIAGNOSIS — F419 Anxiety disorder, unspecified: Secondary | ICD-10-CM

## 2021-07-23 DIAGNOSIS — Z30431 Encounter for routine checking of intrauterine contraceptive device: Secondary | ICD-10-CM

## 2021-07-23 NOTE — Patient Instructions (Addendum)
Resources for [facility] of Care  [address], [place], [place] [phone]  [facility] [address], [place], [place] [phone] or [phone] [email or web address]  [facility], 8:30-5:00 [address], [place], [place] [phone] [email or web address]  *Bring your own interpreter at 1st visit  [facility] [address], [place], [place] [phone] [email or web address]   Psychotherapeutic Services/ACT Services  [address] [place], [place] [phone]  [facility] Mon-Fri, 8am-3pm [address], [place], [place] [phone] [email or web address]  [facility] [address] [place], [place], [place] [phone]  [facility] [address], [place], [place] [phone]  [facility] of the [place] [address], [place], [place] [phone] *pacientes que hablen espanol, favor comunicarse con el Sr. [place], extension [phone] o la [address], extension [place] para hacer una cita.   [facility] [address] "[place]" [phone] (Habla Espanol)  [facility] [address] [place], [place], [place] [phone]  [facility] [address], [place], [place] [phone]   [facility] HEALS(Healing and Empowering All Survivors)  [address], [address], [place], [place] [phone] [email or web address]  *Uninsured and underinsured, ages 19-64  [facility] [address] [place], [place], [place] [phone] (Habla Espanol)  [facility] [address], [place], [place] [phone] (Habla Espanol)  [facility] [address] [place], [place] [phone] Clarice Pole)  [facility] Mon-Thurs 8:30am-8:00pm/ Fri 8:30-7:00pm [address] [place], [place] (3rd floor, located at corner of [employer] and [employer]) Call [phone] to schedule an appointment [email or web address]  [facility] [address] [place], [place]  [phone]  [facility]  [address] [place], [place] [phone]  Social Support MHAG ([facility] [place]) [phone] or www.[email or web address] [address], [place], [place] [address] * Recovery support and educational programs, including recovery skills classes, support groups, and one-on-one sessions with [facility] Certified Peer Support Specialists.    NAMI [facility] the [facility]ally Ill) [place] NAMI Helpline: [phone] * Family and Friends Support Group/  Contact Philomena Doheny at [phone] for more information * Family to [employer] and Basics Class : enroll online or email Darreld Mclean at [email or web address]  * Monthly educational meetings, contact Dwain Sarna at [phone] [email or web address]    24- Hour Availability:  [facility] [phone] or [phone]  * [facility] Service of the [employer] (Domestic Violence, Rape, Victim Assistance) Line [phone]  [employer] [phone] or [phone]  * [facility] [employer]  [phone] [phone] hours)  *[facility]rapeutic [facility] Mobile Crisis Unit [phone]  *[place] National Suicide Hotline (717) 423-4748

## 2021-07-23 NOTE — Progress Notes (Signed)
GYNECOLOGY OFFICE VISIT NOTE   History:  25 y.o. G1P0010 here today for new GYN visit.  Had Mirena IUD in 2017 in [place]. She denies any abnormal vaginal discharge, bleeding, pelvic pain or other concerns.  She was seen at the ER and had positive chlamydia - has treatment and is too soon for retest.  History reviewed. No pertinent past medical history.  History reviewed. No pertinent surgical history.  The following portions of the patient's history were reviewed and updated as appropriate: allergies, current medications, past family history, past medical history, past social history, past surgical history and problem list.   Health Maintenance:  Has not had pap smear  Review of Systems:  Pertinent items noted in HPI and remainder of comprehensive ROS otherwise negative.  Objective:  Physical Exam BP 128/87   Pulse 71   Wt 257 lb 3.2 oz (116.7 kg)   BMI 44.15 kg/m  CONSTITUTIONAL: Well-developed, well-nourished female in no acute distress.  HENT:  Normocephalic, atraumatic. External right and left ear normal.  EYES: Conjunctivae and EOM are normal. Pupils are equal, round.  No scleral icterus.  NECK: Normal range of motion, supple, no masses SKIN: Skin is warm and dry. No rash noted. Not diaphoretic. No erythema. No pallor. NEUROLOGIC: Alert and oriented to person, place, and time. Normal muscle tone coordination. No cranial nerve deficit noted. PSYCHIATRIC: Normal mood and affect. Normal behavior. Normal judgment and thought content. CARDIOVASCULAR: Normal heart rate noted RESPIRATORY: Effort and breath sounds normal, no problems with respiration noted ABDOMEN: Soft, no distention noted.   PELVIC: Deferred MUSCULOSKELETAL: Normal range of motion. No edema noted.  Labs and Imaging DG Chest Portable 1 View  Result Date: 07/19/2021 CLINICAL DATA:  Inhalation injury. EXAM: PORTABLE CHEST 1 VIEW COMPARISON:  Chest x-ray dated March 03, 2021. FINDINGS: The heart size and  mediastinal contours are within normal limits. Both lungs are clear. The visualized skeletal structures are unremarkable. IMPRESSION: No active disease. Electronically Signed   By: Obie Dredge M.D.   On: 07/19/2021 06:46    Assessment & Plan:  1. Surveillance of previously prescribed intrauterine contraceptive device Client thought the Mirena was expired - placed in 2017.   Reviewed info on Mirena now being FDA approved for 7 years. She has no menses now and before the IUD was placed she had 7 days of heavy bleeding Assured that the IUD is continuing to work well for her and that menses would begin to return as hormone wanes. She has no insurance - planning to get soon through the marketplace, rather than at her new job that starts tomorrow. Has had several ER visits this month. Discussed getting health care that meets her needs and does not cause a further large expenses. She is planning [facility] and pap Also discussed resource of [facility] free pap clinics.  2. Anxiety Seemed stressed and with time waiting in room after provider visit, client began crying and saying anxiety is getting the best of her. No suicidal ideations. Reports she only fell asleep this morning after lying awake all night.  - Ambulatory referral to Integrated Behavioral Health  3. BMI 40.0-44.9, adult (HCC)   4. Current occasional smoker Smokes Blacks - not every day. Advised to stop smoking, but due to stress and anxiety today, was not able to discuss in depth with her.   Routine preventative health maintenance measures emphasized. Please refer to After Visit Summary for other counseling recommendations.  Return if symptoms worsen or fail to improve.   Total face-to-face time with patient: 15 minutes.  Over 50% of encounter was spent on counseling and coordination of care.  Nolene Bernheim, RN, MSN, NP-BC Nurse Practitioner, [facility]  for [employer], [facility] 07/23/2021 9:29 AM

## 2021-07-24 ENCOUNTER — Ambulatory Visit: Payer: Self-pay | Admitting: Clinical

## 2021-07-24 DIAGNOSIS — F430 Acute stress reaction: Secondary | ICD-10-CM

## 2021-07-24 NOTE — Patient Instructions (Addendum)
Center for [facility] at [facility] [place],  [id number] [phone] (main office) [phone] ([place] office)  Coping with Panic Attacks   What is a panic attack?  You may have had a panic attack if you experienced four or more of the symptoms listed below coming on abruptly and peaking in about 10 minutes.  Panic Symptoms    Pounding heart   Sweating   Trembling or shaking   Shortness of breath   Feeling of choking   Chest pain   Nausea or abdominal distress     Feeling dizzy, unsteady, lightheaded, or faint   Feelings of unreality or being detached from yourself   Fear of losing control or going crazy   Fear of dying   Numbness or tingling   Chills or hot flashes      Panic attacks are sometimes accompanied by avoidance of certain places or situations. These are often situations that would be difficult to escape from or in which help might not be available. Examples might include crowded shopping malls, public transportation, restaurants, or driving.   Why do panic attacks occur?   Panic attacks are the body's alarm system gone awry. All of [place] have a built-in alarm system, powered by adrenaline, which increases our heart rate, breathing, and blood flow in response to danger. Ordinarily, this 'danger response system' works well. In some people, however, the response is either out of proportion to whatever stress is going on, or may come out of the blue without any stress at all.   For example, if you are walking in the woods and see a bear coming your way, a variety of changes occur in your body to prepare you to either fight the danger or flee from the situation. Your heart rate will increase to get more blood flow around your body, your breathing rate will quicken so that more oxygen is available, and your muscles will tighten in order to be ready to fight or run. You may feel nauseated as blood flow leaves your stomach area and  moves into your limbs. These bodily changes are all essential to helping you survive the dangerous situation. After the danger has passed, your body functions will begin to go back to normal. This is because your body also has a system for "recovering" by bringing your body back down to a normal state when the danger is over.   As you can see, the emergency response system is adaptive when there is, in fact, a "true" or "real" danger (e.g., bear). However, sometimes people find that their emergency response system is triggered in "everyday" situations where there really is no true physical danger (e.g., in a meeting, in the grocery store, while driving in normal traffic, etc.).   What triggers a panic attack?  Sometimes particularly stressful situations can trigger a panic attack. For example, an argument with your spouse or stressors at work can cause a stress response (activating the emergency response system) because you perceive it as threatening or overwhelming, even if there is no direct risk to your survival.  Sometimes panic attacks don't seem to be triggered by anything in particular- they may "come out of the blue". Somehow, the natural "fight or flight" emergency response system has gotten activated when there is no real danger. Why does the body go into "emergency mode" when there is no real danger?   Often, people with panic attacks are frightened or alarmed by the physical sensations of  the emergency response system. First, unexpected physical sensations are experienced (tightness in your chest or some shortness of breath). This then leads to feeling fearful or alarmed by these symptoms ("Something's wrong!", "Am I having a heart attack?", "Am I going to faint?") The mind perceives that there is a danger even though no real danger exists. This, in turn, activates the emergency response system ("fight or flight"), leading to a "full blown" panic attack. In summary, panic attacks occur when we  misinterpret physical symptoms as signs of impending death, craziness, loss of control, embarrassment, or fear of fear. Sometimes you may be aware of thoughts of danger that activate the emergency response system (for example, thinking "I'm having a heart attack" when you feel chest pressure or increased heart rate). At other times, however, you may not be aware of such thoughts. After several incidences of being afraid of physical sensations, anxiety and panic can occur in response to the initial sensations without conscious thoughts of danger. Instead, you just feel afraid or alarmed. In other words, the panic or fear may seem to occur "automatically" without you consciously telling yourself anything.   After having had one or more panic attacks, you may also become more focused on what is going on inside your body. You may scan your body and be more vigilant about noticing any symptoms that might signal the start of a panic attack. This makes it easier for panic attacks to happen again because you pick up on sensations you might otherwise not have noticed, and misinterpret them as something dangerous. A panic attack may then result.      How do I cope with panic attacks?  An important part of overcoming panic attacks involves re-interpreting your body's physical reactions and teaching yourself ways to decrease the physical arousal. This can be done through practicing the cognitive and behavioral interventions below.   Research has found that over half of people who have panic attacks show some signs of hyperventilation or overbreathing. This can produce initial sensations that alarm you and lead to a panic attack. Overbreathing can also develop as part of the panic attack and make the symptoms worse. When people hyperventilate, certain blood vessels in the body become narrower. In particular, the brain may get slightly less oxygen. This can lead to the symptoms of dizziness, confusion, and  lightheadedness that often occur during panic attacks. Other parts of the body may also get a bit less oxygen, which may lead to numbness or tingling in the hands or feet or the sensation of cold, clammy hands. It also may lead the heart to pump harder. Although these symptoms may be frightening and feel unpleasant, it is important to remember that hyperventilating is not dangerous. However, you can help overcome the unpleasantness of overbreathing by practicing Breathing Retraining.   Practice this basic technique three times a day, every day:   Inhale. With your shoulders relaxed, inhale as slowly and deeply as you can while you count to six. If you can, use your diaphragm to fill your lungs with air.   Hold. Keep the air in your lungs as you slowly count to four.   Exhale. Slowly breath out as you count to six.   Repeat. Do the inhale-hold-exhale cycle several times. Each time you do it, exhale for longer counts.  Like any new skill, Breathing Retraining requires practice. Try practicing this skill twice a day for several minutes. Initially, do not try this technique in specific situations or when you  become frightened or have a panic attack. Begin by practicing in a quiet environment to build up your skill level so that you can later use it in time of "emergency."   2. Decreasing Avoidance  Regardless of whether you can identify why you began having panic attacks or whether they seemed to come out of the blue, the places where you began having panic attacks often can become triggers themselves. It is not uncommon for individuals to begin to avoid the places where they have had panic attacks. Over time, the individual may begin to avoid more and more places, thereby decreasing their activities and often negatively impacting their quality of life. To break the cycle of avoidance, it is important to first identify the places or situations that are being avoided, and then to do some "relearning."  To  begin this intervention, first create a list of locations or situations that you tend to avoid. Then choose an avoided location or situation that you would like to target first. Now develop an "exposure hierarchy" for this situation or location. An "exposure hierarchy" is a list of actions that make you feel anxious in this situation. Order these actions from least to most anxiety-producing. It is often helpful to have the first item on your hierarchy involve thinking or imagining part of the feared/avoided situation.   Here is an example of an exposure hierarchy for decreasing avoidance of the grocery store. Note how it is ordered from the least amount of anxiety (at the top) to the most anxiety (at the bottom):   Think about going to the grocery store alone.   Go to the grocery store with a friend or family member.   Go to the grocery store alone to pick up a few small items (5-10 minutes in the store).   Shopping for 10-20 minutes in the store alone.   Doing the shopping for the week by myself (20-30 minutes in the store).   Your homework is to "expose" yourself to the lowest item on your hierarchy and use your breathing relaxation and coping statements (see below) to help you remain in the situation. Practice this several times during the upcoming week. Once you have mastered each item with minimal anxiety, move on to the next higher action on your list.   Cognitive Interventions  Identify your negative self-talk Anxious thoughts can increase anxiety symptoms and panic. The first step in changing anxious thinking is to identify your own negative, alarming self-talk. Some common alarming thoughts:  I'm having a heart attack.            I must be going crazy. I think I'm dying. People will think I'm crazy. I'm going to pass our.  Oh no- here it comes.  I can't stand this.  I've got to get out of here!  2. Use positive coping statements Changing or disrupting a pattern of anxious thoughts  by replacing them with more calming or supportive statements can help to divert a panic attack. Some common helpful coping statements:  This is not an emergency.  I don't like feeling this way, but I can accept it.  I can feel like this and still be okay.  This has happened before, and I was okay. I'll be okay this time, too.  I can be anxious and still deal with this situation.   /Emotional Wellbeing Apps and Websites Here are a few free apps meant to help you to help yourself.  To find, try searching on   the internet to see if the app is offered on Apple/Android devices. If your first choice doesn't come up on your device, the good news is that there are many choices! Play around with different apps to see which ones are helpful to you.    Calm This is an app meant to help increase calm feelings. Includes info, strategies, and tools for tracking your feelings.      Calm Harm  This app is meant to help with self-harm. Provides many 5-minute or 15-min coping strategies for doing instead of hurting yourself.       Healthy Minds Health Minds is a problem-solving tool to help deal with emotions and cope with stress you encounter wherever you are.      MindShift This app can help people cope with anxiety. Rather than trying to avoid anxiety, you can make an important shift and face it.      MY3  MY3 features a support system, safety plan and resources with the goal of offering a tool to use in a time of need.       My Life My Voice  This mood journal offers a simple solution for tracking your thoughts, feelings and moods. Animated emoticons can help identify your mood.       Relax Melodies Designed to help with sleep, on this app you can mix sounds and meditations for relaxation.      Smiling Mind Smiling Mind is meditation made easy: it's a simple tool that helps put a smile on your mind.        Stop, Breathe & Think  A friendly, simple guide for people through  meditations for mindfulness and compassion.  Stop, Breathe and Think Kids Enter your current feelings and choose a "mission" to help you cope. Offers videos for certain moods instead of just sound recordings.       Team Orange The goal of this tool is to help teens change how they think, act, and react. This app helps you focus on your own good feelings and experiences.      The [employer] Box The [employer] Box (VHB) contains simple tools to help patients with coping, relaxation, distraction, and positive thinking.

## 2021-07-24 NOTE — BH Specialist Note (Addendum)
Integrated Behavioral Health via Telemedicine Visit  07/24/2021 Katelyn Parker [id number]  Number of Integrated Behavioral Health visits: 1 Session Start time: 3:16  Session End time: 3:31 Total time: 15  Referring Provider: Nolene Bernheim, NP Patient/Family location: Home [facility] Provider location: Center for [facility] at [facility]  All persons participating in visit: Patient Katelyn Parker and [facility] Kainon Varady   Types of Service: Individual psychotherapy and Video visit  I connected with [name] and/or Darrol Poke  n/a  via  Telephone or Video Enabled Telemedicine Application  (Video is Caregility application) and verified that I am speaking with the correct person using two identifiers. Discussed confidentiality: Yes   I discussed the limitations of telemedicine and the availability of in person appointments.  Discussed there is a possibility of technology failure and discussed alternative modes of communication if that failure occurs.  I discussed that engaging in this telemedicine visit, they consent to the provision of behavioral healthcare and the services will be billed under their insurance.  Patient and/or legal guardian expressed understanding and consented to Telemedicine visit: Yes   Presenting Concerns: Patient and/or family reports the following symptoms/concerns: Pt states her primary concern today is very little sleep in past week (about 6 hours total) since traumatic incident, poor appetite, "emotions all over the place", anxiety, panic; pt has not been treated in the past for anxiety or depression, but took Lexapro twice(not prescribed) and helped with anxiety; has taken melatonin in past to help sleep. Pt is open to learning self-coping strategy today to manage current emotions.  Duration of problem: Increase in past week; Severity of problem: severe  Patient and/or Family's Strengths/Protective Factors: Concrete supports in  place (healthy food, safe environments, etc.)  Goals Addressed: Patient will:  Reduce symptoms of: anxiety, depression, insomnia, and stress   Increase knowledge and/or ability of: coping skills   Demonstrate ability to: Increase healthy adjustment to current life circumstances and Increase motivation to adhere to plan of care  Progress towards Goals: Ongoing  Interventions: Interventions utilized:  Mindfulness or Management consultant, Sleep Hygiene, and Psychoeducation and/or Health Education Standardized Assessments completed: Not Needed  Patient and/or Family Response: Pt agrees to treatment plan  Assessment: Patient currently experiencing Acute stress reaction.   Patient may benefit from psychoeducation and brief therapeutic interventions regarding coping with symptoms of anxiety with panic, depression, stress, insomnia .  Plan: Follow up with behavioral health clinician on : Tomorrow, 07/25/21 for brief check; will schedule follow up at that time Behavioral recommendations:  -CALM relaxation breathing exercise twice daily (morning; at bedtime with sleep sounds) -Accept referral to [facility] for ongoing [facility] treatment -Take over-the-counter melatonin tonight, per medical provider -If unable to sleep tonight, use apps as healthy distractions (on After Visit Summary) -Consider Read educational materials regarding coping with symptoms of anxiety with panic  Referral(s): Integrated [name] (In Clinic)  I discussed the assessment and treatment plan with the patient and/or parent/guardian. They were provided an opportunity to ask questions and all were answered. They agreed with the plan and demonstrated an understanding of the instructions.   They were advised to call back or seek an in-person evaluation if the symptoms worsen or if the condition fails to improve as anticipated.  [name], LCSW  Depression screen  [facility] 2/9 07/23/2021  Decreased Interest 2  Down, Depressed, Hopeless 2  PHQ - 2 Score 4  Altered sleeping 3  Tired, decreased energy 2  Change in appetite 3  Feeling bad or failure about yourself  0  Trouble concentrating 2  Moving slowly or fidgety/restless 1  Suicidal thoughts 0  PHQ-9 Score 15   GAD 7 : Generalized Anxiety Score 07/23/2021  Nervous, Anxious, on Edge 3  Control/stop worrying 3  Worry too much - different things 3  Trouble relaxing 3  Restless 3  Easily annoyed or irritable 3  Afraid - awful might happen 3  Total GAD 7 Score 21

## 2021-07-25 ENCOUNTER — Other Ambulatory Visit: Payer: Self-pay | Admitting: Nurse Practitioner

## 2021-07-25 ENCOUNTER — Telehealth: Payer: Self-pay | Admitting: Clinical

## 2021-07-25 MED ORDER — ESCITALOPRAM OXALATE 10 MG PO TABS: 10.0000 mg | ORAL_TABLET | Freq: Every day | ORAL | 1 refills | Status: DC

## 2021-07-25 NOTE — Telephone Encounter (Signed)
F/U call, as agreed-upon: pt took melatonin last night, and slept a total of about 5 hours (12-3am; 5-7pm); wants [place] to know she has updated her insurance, she has [employer], and would like to start Lexapro.

## 2021-07-25 NOTE — Progress Notes (Signed)
Has sen Behavioral Health in the office and is requesting medication now.  Lexapro 10 mg PO daily prescribed.  Will continue follow up as scheduled.  Nolene Bernheim, RN, MSN, NP-BC Nurse Practitioner, [facility] for [employer], [facility] 07/25/2021 5:50 PM

## 2021-07-27 ENCOUNTER — Emergency Department (HOSPITAL_COMMUNITY): Payer: 59

## 2021-07-27 ENCOUNTER — Emergency Department (HOSPITAL_COMMUNITY)
Admission: EM | Admit: 2021-07-27 | Discharge: 2021-07-27 | Disposition: A | Discharge status: Home / Self Care | Payer: 59 | Attending: Emergency Medicine | Admitting: Emergency Medicine

## 2021-07-27 DIAGNOSIS — R202 Paresthesia of skin: Secondary | ICD-10-CM | POA: Diagnosis not present

## 2021-07-27 DIAGNOSIS — B349 Viral infection, unspecified: Secondary | ICD-10-CM | POA: Insufficient documentation

## 2021-07-27 DIAGNOSIS — R519 Headache, unspecified: Secondary | ICD-10-CM | POA: Diagnosis not present

## 2021-07-27 DIAGNOSIS — R079 Chest pain, unspecified: Secondary | ICD-10-CM | POA: Diagnosis present

## 2021-07-27 DIAGNOSIS — F17200 Nicotine dependence, unspecified, uncomplicated: Secondary | ICD-10-CM | POA: Diagnosis not present

## 2021-07-27 DIAGNOSIS — R0602 Shortness of breath: Secondary | ICD-10-CM | POA: Insufficient documentation

## 2021-07-27 LAB — BASIC METABOLIC PANEL
Anion gap: 8 (ref 5–15)
BUN: 8 mg/dL (ref 6–20)
CO2: 22 mmol/L (ref 22–32)
Calcium: 9 mg/dL (ref 8.9–10.3)
Chloride: 107 mmol/L (ref 98–111)
Creatinine, Ser: 0.87 mg/dL (ref 0.44–1.00)
GFR, Estimated: >60 mL/min (ref >60)
Glucose, Bld: 95 mg/dL (ref 70–99)
Potassium: 3.7 mmol/L (ref 3.5–5.1)
Sodium: 137 mmol/L (ref 135–145)

## 2021-07-27 LAB — I-STAT BETA HCG BLOOD, ED (MC, WL, AP ONLY): I-stat hCG, quantitative: <5 m[IU]/mL (ref <5)

## 2021-07-27 LAB — CBC
HCT: 41.6 % (ref 36.0–46.0)
Hemoglobin: 13.2 g/dL (ref 12.0–15.0)
MCH: 27.4 pg (ref 26.0–34.0)
MCHC: 31.7 g/dL (ref 30.0–36.0)
MCV: 86.5 fL (ref 80.0–100.0)
Platelets: 286 10*3/uL (ref 150–400)
RBC: 4.81 MIL/uL (ref 3.87–5.11)
RDW: 13.4 % (ref 11.5–15.5)
WBC: 5.7 10*3/uL (ref 4.0–10.5)
nRBC: 0 % (ref 0.0–0.2)

## 2021-07-27 LAB — TROPONIN I (HIGH SENSITIVITY)
Troponin I (High Sensitivity): 4 ng/L (ref <18)
Troponin I (High Sensitivity): 3 ng/L (ref <18)

## 2021-07-27 NOTE — ED Triage Notes (Signed)
Pt c/o CP, SHOB, HA, tingling "all over" x "a while- 1.5wks." Pt states recent covid test was negative. Unknown sick contact, not vaccinated.  9/10 CP, HA

## 2021-07-27 NOTE — Discharge Instructions (Addendum)
You were evaluated in the Emergency Department and after careful evaluation, we did not find any emergent condition requiring admission or further testing in the hospital.  Your exam/testing today is overall reassuring.  Symptoms likely due to a viral illness.  Recommend Tylenol or Motrin for discomfort at home.  Drink plenty of fluids.  Please return to the Emergency Department if you experience any worsening of your condition.   Thank you for allowing [place] to be a part of your care.

## 2021-07-27 NOTE — ED Notes (Signed)
Patient verbalizes understanding of discharge instructions. Opportunity for questioning and answers were provided. Armband removed by staff, pt discharged from ED ambulatory.   

## 2021-07-27 NOTE — ED Provider Notes (Signed)
[facility] Emergency Department Provider Note MRN:  [id number]  Arrival date & time: 07/27/21     Chief Complaint   Chest Pain, Shortness of Breath, and Headache   History of Present Illness   Katelyn Parker is a 25 y.o. year-old female with no pertinent past medical history presenting to the ED with chief complaint of chest pain.  Location: Central chest Duration: 1 to 2 weeks Onset: Gradual Timing: Constant Description: Pressure Severity: Mild to moderate Exacerbating/Alleviating Factors: None Associated Symptoms: Shortness of breath, headache, tingling of her body Pertinent Negatives: Denies nausea vomiting, no diarrhea, no abdominal pain  Additional History: Tested negative for COVID at home  Review of Systems  A complete 10 system review of systems was obtained and all systems are negative except as noted in the HPI and PMH.   Patient's Health History   No past medical history on file.  No past surgical history on file.  No family history on file.  Social History   Socioeconomic History   Marital status: Single    Spouse name: Not on file   Number of children: Not on file   Years of education: Not on file   Highest education level: Not on file  Occupational History   Not on file  Tobacco Use   Smoking status: Some Days   Smokeless tobacco: Never  Substance and Sexual Activity   Alcohol use: Yes   Drug use: Not Currently   Sexual activity: Not on file  Other Topics Concern   Not on file  Social History Narrative   Not on file   Social Determinants of Health   Financial Resource Strain: Not on file  Food Insecurity: Not on file  Transportation Needs: Not on file  Physical Activity: Not on file  Stress: Not on file  Social Connections: Not on file  Intimate Partner Violence: Not on file     Physical Exam   Vitals:   07/27/21 0315 07/27/21 0330  BP: 131/90 122/80  Pulse: 64 63  Resp: (!) 26 17  Temp:    SpO2: 100% 99%     CONSTITUTIONAL: Well-appearing, NAD NEURO:  Alert and oriented x 3, no focal deficits EYES:  eyes equal and reactive ENT/NECK:  no LAD, no JVD CARDIO: Regular rate, well-perfused, normal S1 and S2 PULM:  CTAB no wheezing or rhonchi GI/GU:  normal bowel sounds, non-distended, non-tender MSK/SPINE:  No gross deformities, no edema SKIN:  no rash, atraumatic PSYCH:  Appropriate speech and behavior  *Additional and/or pertinent findings included in MDM below  Diagnostic and Interventional Summary    EKG Interpretation  Date/Time:  Friday July 27 2021 01:12:55 EDT Ventricular Rate:  59 PR Interval:  144 QRS Duration: 84 QT Interval:  398 QTC Calculation: 394 R Axis:   69 Text Interpretation: Sinus bradycardia Otherwise normal ECG Confirmed by Gerlene Fee [phone]) on 07/27/2021 4:21:37 AM       Labs Reviewed  BASIC METABOLIC PANEL  CBC  I-STAT BETA HCG BLOOD, ED ([facility], [facility], [facility] ONLY)  TROPONIN I (HIGH SENSITIVITY)  TROPONIN I (HIGH SENSITIVITY)    DG Chest 2 View  Final Result      Medications - No data to display   Procedures  /  Critical Care Procedures  ED Course and Medical Decision Making  I have reviewed the triage vital signs, the nursing notes, and pertinent available records from the EMR.  Listed above are laboratory and imaging tests that I personally ordered, reviewed, and  interpreted and then considered in my medical decision making (see below for details).  Myriad of symptoms, favoring viral process.  Has been tested for COVID recently and is negative.  Given the chest pain also considering myocarditis, pneumonia.  Patient is without tachycardia, no hypoxia, no increased work of breathing, no evidence of DVT, highly doubt PE.  With a reassuring work-up patient will be appropriate for discharge.       Michael M. Bero, MD [facility] [email or web address]  Final Clinical Impressions(s) / ED Diagnoses      ICD-10-CM   1. Viral illness  B34.9       ED Discharge Orders     None        Discharge Instructions Discussed with and Provided to Patient:     Discharge Instructions      You were evaluated in the Emergency Department and after careful evaluation, we did not find any emergent condition requiring admission or further testing in the hospital.  Your exam/testing today is overall reassuring.  Symptoms likely due to a viral illness.  Recommend Tylenol or Motrin for discomfort at home.  Drink plenty of fluids.  Please return to the Emergency Department if you experience any worsening of your condition.   Thank you for allowing us to be a part of your care.        Bero, Michael M, MD 07/27/21 0508  

## 2021-07-28 ENCOUNTER — Other Ambulatory Visit (HOSPITAL_COMMUNITY)
Admission: EM | Admit: 2021-07-28 | Discharge: 2021-08-01 | Disposition: A | Discharge status: Home / Self Care | Payer: 59 | Attending: Urology | Admitting: Urology

## 2021-07-28 ENCOUNTER — Other Ambulatory Visit: Payer: Self-pay

## 2021-07-28 DIAGNOSIS — F17200 Nicotine dependence, unspecified, uncomplicated: Secondary | ICD-10-CM | POA: Insufficient documentation

## 2021-07-28 DIAGNOSIS — F322 Major depressive disorder, single episode, severe without psychotic features: Secondary | ICD-10-CM | POA: Diagnosis present

## 2021-07-28 DIAGNOSIS — F332 Major depressive disorder, recurrent severe without psychotic features: Secondary | ICD-10-CM | POA: Diagnosis not present

## 2021-07-28 DIAGNOSIS — R45851 Suicidal ideations: Secondary | ICD-10-CM | POA: Insufficient documentation

## 2021-07-28 DIAGNOSIS — Z20822 Contact with and (suspected) exposure to covid-19: Secondary | ICD-10-CM | POA: Diagnosis not present

## 2021-07-28 DIAGNOSIS — Z79899 Other long term (current) drug therapy: Secondary | ICD-10-CM | POA: Insufficient documentation

## 2021-07-28 DIAGNOSIS — F411 Generalized anxiety disorder: Secondary | ICD-10-CM | POA: Insufficient documentation

## 2021-07-28 DIAGNOSIS — G4700 Insomnia, unspecified: Secondary | ICD-10-CM | POA: Insufficient documentation

## 2021-07-28 LAB — POCT URINE DRUG SCREEN - MANUAL ENTRY (I-SCREEN)
POC Amphetamine UR: NOT DETECTED
POC Buprenorphine (BUP): NOT DETECTED
POC Cocaine UR: NOT DETECTED
POC Marijuana UR: POSITIVE — AB
POC Methadone UR: NOT DETECTED
POC Methamphetamine UR: NOT DETECTED
POC Morphine: NOT DETECTED
POC Oxazepam (BZO): NOT DETECTED
POC Oxycodone UR: NOT DETECTED
POC Secobarbital (BAR): NOT DETECTED

## 2021-07-28 LAB — POC SARS CORONAVIRUS 2 AG -  ED: SARS Coronavirus 2 Ag: NEGATIVE

## 2021-07-28 LAB — POCT PREGNANCY, URINE: Preg Test, Ur: NEGATIVE

## 2021-07-28 LAB — POC SARS CORONAVIRUS 2 AG: SARSCOV2ONAVIRUS 2 AG: NEGATIVE

## 2021-07-28 MED ORDER — ACETAMINOPHEN 325 MG PO TABS
650.0000 mg | ORAL_TABLET | Freq: Four times a day (QID) | ORAL | Status: DC | PRN
  Administered 2021-07-29 – 2021-07-30 (×4): 650 mg via ORAL
  Filled 2021-07-28 (×4): qty 2

## 2021-07-28 MED ORDER — MAGNESIUM HYDROXIDE 400 MG/5ML PO SUSP
30.0000 mL | Freq: Every day | ORAL | Status: DC | PRN
Start: 2021-07-28 — End: 2021-08-01

## 2021-07-28 MED ORDER — TRAZODONE HCL 50 MG PO TABS
50.0000 mg | ORAL_TABLET | Freq: Every evening | ORAL | Status: DC | PRN
  Administered 2021-07-29 – 2021-07-30 (×2): 50 mg via ORAL
  Filled 2021-07-28 (×3): qty 1

## 2021-07-28 MED ORDER — ALUM & MAG HYDROXIDE-SIMETH 200-200-20 MG/5ML PO SUSP: 30.0000 mL | ORAL | Status: DC | PRN

## 2021-07-28 MED ORDER — HYDROXYZINE HCL 25 MG PO TABS
25.0000 mg | ORAL_TABLET | Freq: Three times a day (TID) | ORAL | Status: DC | PRN
  Administered 2021-07-29 – 2021-08-01 (×9): 25 mg via ORAL
  Filled 2021-07-28 (×9): qty 1
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
## 2021-07-28 NOTE — ED Notes (Signed)
Spoke with provider in regards to blood labs to be drawn this visit.  Pt recently had blood drawn on 07/27/2021.  Provider agreed that not all labs need to be redrawn at this time, all that is needed is a TSH level.  TSH was drawn and STAT lab courier was contacted to transport all STAT labs to [facility] lab.

## 2021-07-28 NOTE — ED Notes (Signed)
Pt 15 min Covid test resulted negative allowing for staff to move pt in to the observation area.  Prior to having pt step onto the unit a skin check was done and secondary belongings search.  No additional belongings taken.  Noted tattoos on skin located on left and right shoulder, left wrist, right hand, and right thigh.  No open areas or scars noted.  Pt did have ties in her hoodie and sweat pants.  Pt gave permission for staff to cut ties out of both items to ensure safety.  Will continue to monitor for safety.
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
## 2021-07-29 ENCOUNTER — Encounter (HOSPITAL_COMMUNITY): Payer: Self-pay | Admitting: Urology

## 2021-07-29 DIAGNOSIS — F332 Major depressive disorder, recurrent severe without psychotic features: Secondary | ICD-10-CM

## 2021-07-29 DIAGNOSIS — F411 Generalized anxiety disorder: Secondary | ICD-10-CM | POA: Diagnosis not present

## 2021-07-29 LAB — RESP PANEL BY RT-PCR (FLU A&B, COVID) ARPGX2
Influenza A by PCR: NEGATIVE
Influenza B by PCR: NEGATIVE
SARS Coronavirus 2 by RT PCR: NEGATIVE

## 2021-07-29 LAB — TSH: TSH: 2.039 u[IU]/mL (ref 0.350–4.500)

## 2021-07-29 MED ORDER — BUSPIRONE HCL 5 MG PO TABS
10.0000 mg | ORAL_TABLET | Freq: Two times a day (BID) | ORAL | Status: DC
  Administered 2021-07-29 – 2021-08-01 (×6): 10 mg via ORAL
  Filled 2021-07-29 (×6): qty 2

## 2021-07-29 MED ORDER — ESCITALOPRAM OXALATE 10 MG PO TABS
10.0000 mg | ORAL_TABLET | Freq: Every day | ORAL | Status: DC
  Administered 2021-07-29 – 2021-08-01 (×4): 10 mg via ORAL
  Filled 2021-07-29 (×4): qty 1

## 2021-07-29 NOTE — ED Notes (Signed)
PT is in room sleeping, respirations are even/unlabored, environment check complete/secure, will continue to monitor patient for safety. 

## 2021-07-29 NOTE — ED Notes (Signed)
Pt came to nurses station asking for a snack stating she was hungry. Pt is in dining room eating a sandwich, chips, and drinking grape juice.

## 2021-07-29 NOTE — ED Notes (Signed)
Patient is in the day room eating a [place] sandwich. She spoke with her mother earlier, her mother did not know she was here. Patient requested trazodone for sleep. She was given 50 mg. Trazodone around 2300. Will continue to monitor.

## 2021-07-29 NOTE — ED Notes (Signed)
Patient likes to be called "Ray".

## 2021-07-29 NOTE — ED Provider Notes (Addendum)
Behavioral Health Admission H&P [facility](FBC & OBS)  Date: 07/29/21 Patient Name: Katelyn Parker MRN: [id number] Chief Complaint:  Chief Complaint  Patient presents with   Depression   Anxiety      Diagnoses:  Final diagnoses:  Severe episode of recurrent major depressive disorder, without psychotic features (HCC)  Generalized anxiety disorder    HPI: Katelyn OhLaraysha Kaczmarczyk is a 25y/o female. [facility] of worsening anxiety and depression. Patient is accompanied by her cousin, Paulla ForeRegina Mcphanl [phone] who remained in assessment room per patient's request.   Patient reports that she started taking Lexapro 10mg /day 1 week ago for depression and anxiety. She reports that she experiences about 2-3 panic attacks/day and visited the ED several times due to chest pain from panic attacks. She states "anxiety and panic attacks are overwhelming my life. I can't function or do anything." She reports that she is afraid of falling asleep due to "fear that I will die in my sleep." She reports that over the past 2-3 weeks she has been sleeping 30 minutes to 1 hour per day. She reports that she is "paranoia of dying in my sleep and I feel like my room is closing in on me." She reports worsening  depression due to uncontrolled anxiety. She endorses depressive symptoms of crying spells, hopelessness, irritability, insomnia, fatigue, and poor appetite. She report that she recently experienced "trauma" but refused to elaborate. She denies any identifiable stressor/triggers.   Patient was seen face to face and her chart was reviewed by this NP. Patient is alert and oriented; patient is tearful and cooperative. Patient is speaking in a low voice and very tearful while answering questions.Patient reports her mood as depressed and her affect is congruent with her stated mood. She reports worsening anxiety and depression since July 2022 and unable to identify any stressors. Per chart,  patient's home was raided by Patent examinerlaw enforcement on 07/19/2021 (unsure if this is contributing to her symptoms). She denies HI/AVH/ and no delusional thought content noted during assessment. Patient denies substance abuse expect for marijuana. She also denies current chest pain or SOB. She is reporting headache 9/10 (10 is worst). She denies all other medical complaint.  She is endorsing passive suicidal ideation of "I wish a bad thing will happen to me and I don't wake up, so I don't have to keep dealing with this." She denies any plans or intent to harm herself. She denies prior SI or SA. She is also endorsing paranoia of "dying in my sleep and I feel like my room is closing in on me." She lives at home with her aunt and cousin Paulla ForeRegina Mcphanl.  PHQ 2-9:  BlueLinxFlowsheet Row Office [facility]  Thoughts that you would be better off dead, or of hurting yourself in some way Not at all  PHQ-9 Total Score 15       Flowsheet Row [facility]  C-SSRS RISK CATEGORY No Risk No Risk No Risk        Total Time spent with patient: 20 minutes  Musculoskeletal  Strength & Muscle Tone: within normal limits Gait & Station: normal Patient leans: Right  Psychiatric Specialty Exam  Presentation General Appearance: Appropriate for Environment  Eye Contact:Minimal  Speech:Clear and Coherent  Speech Volume:Decreased  Handedness:Right   Mood and Affect  Mood:Anxious; Depressed  Affect:Tearful   Thought Process  Thought Processes:Coherent  Descriptions of Associations:Intact  Orientation:Full (Time, Place and Person)  Thought Content:Paranoid Ideation  Diagnosis of Schizophrenia or Schizoaffective disorder in past: No    Hallucinations:Hallucinations: None  Ideas of Reference:None  Suicidal Thoughts:Suicidal Thoughts: Yes, Passive SI Passive Intent and/or Plan: Without Intent; Without Plan  Homicidal Thoughts:Homicidal Thoughts: No   Sensorium  Memory:Immediate Good; Recent Good; Remote Good  Judgment:Good  Insight:Good   Executive Functions  Concentration:Fair  Attention Span:Good  Recall:Good  Fund of Knowledge:Good  Language:Good   Psychomotor Activity  Psychomotor Activity:Psychomotor Activity: Normal   Assets  Assets:Desire for Improvement; Research scientist (medical); Physical Health; Housing   Sleep  Sleep:Sleep: Poor (patient states she sleeps about 30 minutes to 1 hour per day) Number of Hours of Sleep: 1   Nutritional Assessment (For OBS and FBC admissions only) Has the patient had a weight loss or gain of 10 pounds or more in the last 3 months?: No Has the patient had a decrease in food intake/or appetite?: No Does the patient have dental problems?: No Does the patient have eating habits or behaviors that may be indicators of an eating disorder including binging or inducing vomiting?: No Has the patient recently lost weight without trying?: No Has the patient been eating poorly because of a decreased appetite?: No Malnutrition Screening Tool Score: 0   Physical Exam Vitals and nursing note reviewed.  Constitutional:      General: She is not in acute distress.    Appearance: She is well-developed. She is not ill-appearing.  HENT:     Head: Normocephalic and atraumatic.  Eyes:     Conjunctiva/sclera: Conjunctivae normal.  Cardiovascular:     Rate and Rhythm: Normal rate.  Pulmonary:     Effort: Pulmonary effort is normal. No respiratory distress.     Breath sounds: Normal breath sounds.  Abdominal:     Palpations: Abdomen is soft.     Tenderness: There is no abdominal tenderness.  Musculoskeletal:        General: Normal range of motion.     Cervical back: Normal  range of motion.  Skin:    General: Skin is warm and dry.  Neurological:     Mental Status: She is alert and oriented to person, place, and time.  Psychiatric:        Attention and Perception: Attention normal.        Mood and Affect: Mood is anxious and depressed. Affect is tearful.        Speech: Speech normal.        Behavior: Behavior normal. Behavior is cooperative.        Thought Content: Thought content is paranoid. Thought content does not include homicidal ideation. Suicidal: passive SI.Thought content does not include homicidal or suicidal plan.        Cognition and Memory: Cognition normal.        Judgment: Judgment normal.   Review of Systems  Constitutional: Negative.   HENT: Negative.    Eyes: Negative.   Respiratory: Negative.    Cardiovascular: Negative.   Gastrointestinal: Negative.   Genitourinary: Negative.   Musculoskeletal: Negative.   Skin: Negative.   Neurological: Negative.   Endo/Heme/Allergies: Negative.   Psychiatric/Behavioral:  Positive for depression. Negative for hallucinations, memory loss and substance abuse. The patient is nervous/anxious and has insomnia.    Blood pressure 134/83, pulse 86, temperature 98 F (36.7 C), temperature source Oral,  resp. rate 18, SpO2 99 %. There is no height or weight on file to calculate BMI.  Past Psychiatric History: anxiety and depression    Is the patient at risk to self? No  Has the patient been a risk to self in the past 6 months? No .    Has the patient been a risk to self within the distant past? No   Is the patient a risk to others? No   Has the patient been a risk to others in the past 6 months? No   Has the patient been a risk to others within the distant past? No   Past Medical History: History reviewed. No pertinent past medical history. History reviewed. No pertinent surgical history.  Family History: History reviewed. No pertinent family history.  Social History:  Social History    Socioeconomic History   Marital status: Single    Spouse name: Not on file   Number of children: Not on file   Years of education: Not on file   Highest education level: Not on file  Occupational History   Not on file  Tobacco Use   Smoking status: Some Days   Smokeless tobacco: Never  Vaping Use   Vaping Use: Never used  Substance and Sexual Activity   Alcohol use: Yes   Drug use: Not Currently   Sexual activity: Not on file  Other Topics Concern   Not on file  Social History Narrative   Not on file   Social Determinants of Health   Financial Resource Strain: Not on file  Food Insecurity: Not on file  Transportation Needs: Not on file  Physical Activity: Not on file  Stress: Not on file  Social Connections: Not on file  Intimate Partner Violence: Not on file    SDOH:  SDOH Screenings   Alcohol Screen: Not on file  Depression (PHQ2-9): Medium Risk   PHQ-2 Score: 15  Financial Resource Strain: Not on file  Food Insecurity: Not on file  Housing: Not on file  Physical Activity: Not on file  Social Connections: Not on file  Stress: Not on file  Tobacco Use: High Risk   Smoking Tobacco Use: Some Days   Smokeless Tobacco Use: Never  Transportation Needs: Not on file    Last Labs:  Admission on 07/28/2021  Component Date Value Ref Range Status   SARS Coronavirus 2 by RT PCR 07/28/2021 NEGATIVE  NEGATIVE Final   Comment: (NOTE) SARS-CoV-2 target nucleic acids are NOT DETECTED.  The SARS-CoV-2 RNA is generally detectable in upper respiratory specimens during the acute phase of infection. The lowest concentration of SARS-CoV-2 viral copies this assay can detect is 138 copies/mL. A negative result does not preclude SARS-Cov-2 infection and should not be used as the sole basis for treatment or other patient management decisions. A negative result may occur with  improper specimen collection/handling, submission of specimen other than nasopharyngeal swab,  presence of viral mutation(s) within the areas targeted by this assay, and inadequate number of viral copies(<138 copies/mL). A negative result must be combined with clinical observations, patient history, and epidemiological information. The expected result is Negative.  Fact Sheet for Patients:  BloggerCourse.com  Fact Sheet for Healthcare Providers:  SeriousBroker.it  This test is no                          t yet approved or cleared by the [place] FDA and  has been authorized for detection  and/or diagnosis of SARS-CoV-2 by FDA under an Emergency Use Authorization (EUA). This EUA will remain  in effect (meaning this test can be used) for the duration of the COVID-19 declaration under Section 564(b)(1) of the Act, 21 U.S.C.section 360bbb-3(b)(1), unless the authorization is terminated  or revoked sooner.       Influenza A by PCR 07/28/2021 NEGATIVE  NEGATIVE Final   Influenza B by PCR 07/28/2021 NEGATIVE  NEGATIVE Final   Comment: (NOTE) The Xpert Xpress SARS-CoV-2/FLU/RSV plus assay is intended as an aid in the diagnosis of influenza from Nasopharyngeal swab specimens and should not be used as a sole basis for treatment. Nasal washings and aspirates are unacceptable for Xpert Xpress SARS-CoV-2/FLU/RSV testing.  Fact Sheet for Patients: BloggerCourse.com  Fact Sheet for Healthcare Providers: SeriousBroker.it  This test is not yet approved or cleared by the [place] FDA and has been authorized for detection and/or diagnosis of SARS-CoV-2 by FDA under an Emergency Use Authorization (EUA). This EUA will remain in effect (meaning this test can be used) for the duration of the COVID-19 declaration under Section 564(b)(1) of the Act, 21 U.S.C. section 360bbb-3(b)(1), unless the authorization is terminated or revoked.  Performed at [facility], [address] [place], [place] [address]    TSH 07/28/2021 2.039  0.350 - 4.500 uIU/mL Final   Comment: Performed by a 3rd Generation assay with a functional sensitivity of <=0.01 uIU/mL. Performed at [facility], [address], [place], [place] [address]    POC Amphetamine UR 07/28/2021 None Detected  NONE DETECTED (Cut Off Level 1000 ng/mL) Final   POC Secobarbital (BAR) 07/28/2021 None Detected  NONE DETECTED (Cut Off Level 300 ng/mL) Final   POC Buprenorphine (BUP) 07/28/2021 None Detected  NONE DETECTED (Cut Off Level 10 ng/mL) Final   POC Oxazepam (BZO) 07/28/2021 None Detected  NONE DETECTED (Cut Off Level 300 ng/mL) Final   POC Cocaine UR 07/28/2021 None Detected  NONE DETECTED (Cut Off Level 300 ng/mL) Final   POC Methamphetamine UR 07/28/2021 None Detected  NONE DETECTED (Cut Off Level 1000 ng/mL) Final   POC Morphine 07/28/2021 None Detected  NONE DETECTED (Cut Off Level 300 ng/mL) Final   POC Oxycodone UR 07/28/2021 None Detected  NONE DETECTED (Cut Off Level 100 ng/mL) Final   POC Methadone UR 07/28/2021 None Detected  NONE DETECTED (Cut Off Level 300 ng/mL) Final   POC Marijuana UR 07/28/2021 Positive (A) NONE DETECTED (Cut Off Level 50 ng/mL) Final   Preg Test, Ur 07/28/2021 NEGATIVE  NEGATIVE Final   Comment:        THE SENSITIVITY OF THIS METHODOLOGY IS >24 mIU/mL    SARS Coronavirus 2 Ag 07/28/2021 Negative  Negative Final   SARSCOV2ONAVIRUS 2 AG 07/28/2021 NEGATIVE  NEGATIVE Final   Comment: (NOTE) SARS-CoV-2 antigen NOT DETECTED.   Negative results are presumptive.  Negative results do not preclude SARS-CoV-2 infection and should not be used as the sole basis for treatment or other patient management decisions, including infection  control decisions, particularly in the presence of clinical signs and  symptoms consistent with COVID-19, or in those who have been in contact with the virus.  Negative results must be combined with clinical observations, patient history, and  epidemiological information. The expected result is Negative.  Fact Sheet for Patients: https://www.jennings-kim.com/  Fact Sheet for Healthcare Providers: https://alexander-rogers.biz/  This test is not yet approved or cleared by the [place] FDA and  has been authorized for detection and/or  diagnosis of SARS-CoV-2 by FDA under an Emergency Use Authorization (EUA).  This EUA will remain in effect (meaning this test can be used) for the duration of  the COV                          ID-19 declaration under Section 564(b)(1) of the Act, 21 U.S.C. section 360bbb-3(b)(1), unless the authorization is terminated or revoked sooner.    Admission on 07/27/2021, Discharged on 07/27/2021  Component Date Value Ref Range Status   Sodium 07/27/2021 137  135 - 145 mmol/L Final   Potassium 07/27/2021 3.7  3.5 - 5.1 mmol/L Final   Chloride 07/27/2021 107  98 - 111 mmol/L Final   CO2 07/27/2021 22  22 - 32 mmol/L Final   Glucose, Bld 07/27/2021 95  70 - 99 mg/dL Final   Glucose reference range applies only to samples taken after fasting for at least 8 hours.   BUN 07/27/2021 8  6 - 20 mg/dL Final   Creatinine, Ser 07/27/2021 0.87  0.44 - 1.00 mg/dL Final   Calcium 70/62/3762 9.0  8.9 - 10.3 mg/dL Final   GFR, Estimated 07/27/2021 >60  >60 mL/min Final   Comment: (NOTE) Calculated using the CKD-EPI Creatinine Equation (2021)    Anion gap 07/27/2021 8  5 - 15 Final   Performed at [facility], [address] [place], [place] [address]   WBC 07/27/2021 5.7  4.0 - 10.5 K/uL Final   RBC 07/27/2021 4.81  3.87 - 5.11 MIL/uL Final   Hemoglobin 07/27/2021 13.2  12.0 - 15.0 g/dL Final   HCT 76/16/0737 41.6  36.0 - 46.0 % Final   MCV 07/27/2021 86.5  80.0 - 100.0 fL Final   MCH 07/27/2021 27.4  26.0 - 34.0 pg Final   MCHC 07/27/2021 31.7  30.0 - 36.0 g/dL Final   RDW 10/62/6948 13.4  11.5 - 15.5 % Final   Platelets 07/27/2021 286  150 - 400 K/uL Final   nRBC  07/27/2021 0.0  0.0 - 0.2 % Final   Performed at [facility], [address], [place], [place] [address]   Troponin I (High Sensitivity) 07/27/2021 4  <18 ng/L Final   Comment: (NOTE) Elevated high sensitivity troponin I (hsTnI) values and significant  changes across serial measurements may suggest ACS but many other  chronic and acute conditions are known to elevate hsTnI results.  Refer to the "Links" section for chest pain algorithms and additional  guidance. Performed at [facility], [address] [place], [place] [address]    I-stat hCG, quantitative 07/27/2021 <5.0  <5 mIU/mL Final   Comment 3 07/27/2021          Final   Comment:   GEST. AGE      CONC.  (mIU/mL)   <=1 WEEK        5 - 50     2 WEEKS       50 - 500     3 WEEKS       100 - 10,000     4 WEEKS     1,000 - 30,000        FEMALE AND NON-PREGNANT FEMALE:     LESS THAN 5 mIU/mL    Troponin I (High Sensitivity) 07/27/2021 3  <18 ng/L Final   Comment: (NOTE) Elevated high sensitivity troponin I (hsTnI) values and significant  changes across serial measurements may suggest ACS but  many other  chronic and acute conditions are known to elevate hsTnI results.  Refer to the "Links" section for chest pain algorithms and additional  guidance. Performed at [facility], [address], [place], [place] [address]   Admission on 07/19/2021, Discharged on 07/19/2021  Component Date Value Ref Range Status   Color, Urine 07/19/2021 YELLOW  YELLOW Final   APPearance 07/19/2021 HAZY (A) CLEAR Final   Specific Gravity, Urine 07/19/2021 1.021  1.005 - 1.030 Final   pH 07/19/2021 6.0  5.0 - 8.0 Final   Glucose, UA 07/19/2021 NEGATIVE  NEGATIVE mg/dL Final   Hgb urine dipstick 07/19/2021 NEGATIVE  NEGATIVE Final   Bilirubin Urine 07/19/2021 NEGATIVE  NEGATIVE Final   Ketones, ur 07/19/2021 NEGATIVE  NEGATIVE mg/dL Final   Protein, ur 40/34/7425 NEGATIVE  NEGATIVE mg/dL Final   Nitrite 95/63/8756 NEGATIVE   NEGATIVE Final   Leukocytes,Ua 07/19/2021 SMALL (A) NEGATIVE Final   RBC / HPF 07/19/2021 0-5  0 - 5 RBC/hpf Final   WBC, UA 07/19/2021 11-20  0 - 5 WBC/hpf Final   Bacteria, UA 07/19/2021 RARE (A) NONE SEEN Final   Squamous Epithelial / LPF 07/19/2021 0-5  0 - 5 Final   Mucus 07/19/2021 PRESENT   Final   Performed at [facility], [address] [place], [place] [address]   Preg Test, Ur 07/19/2021 NEGATIVE  NEGATIVE Final   Comment:        THE SENSITIVITY OF THIS METHODOLOGY IS >20 mIU/mL. Performed at [facility], [address] [place], [place] [address]    Yeast Wet Prep HPF POC 07/19/2021 NONE SEEN  NONE SEEN Final   Swab received with less than 0.5 mL of saline, saline added to specimen, interpret results with caution.   Trich, Wet Prep 07/19/2021 PRESENT (A) NONE SEEN Final   Clue Cells Wet Prep HPF POC 07/19/2021 PRESENT (A) NONE SEEN Final   WBC, Wet Prep HPF POC 07/19/2021 MANY (A) NONE SEEN Final   Sperm 07/19/2021 NONE SEEN   Final   Performed at [facility], [address], [place], [place] [address]   Neisseria Gonorrhea 07/19/2021 Negative   Final   Chlamydia 07/19/2021 Positive (A)  Final   Comment 07/19/2021 Normal Reference Ranger Chlamydia - Negative   Final   Comment 07/19/2021 Normal Reference Range Neisseria Gonorrhea - Negative   Final  Admission on 06/05/2021, Discharged on 06/05/2021  Component Date Value Ref Range Status   Lipase 06/05/2021 24  11 - 51 U/L Final   Performed at [facility], [address], [place], [place] [address]   Sodium 06/05/2021 137  135 - 145 mmol/L Final   Potassium 06/05/2021 3.5  3.5 - 5.1 mmol/L Final   Chloride 06/05/2021 105  98 - 111 mmol/L Final   CO2 06/05/2021 22  22 - 32 mmol/L Final   Glucose, Bld 06/05/2021 132 (A) 70 - 99 mg/dL Final   Glucose reference range applies only to samples taken after fasting for at least 8 hours.   BUN 06/05/2021 11  6 - 20 mg/dL Final   Creatinine, Ser  06/05/2021 0.96  0.44 - 1.00 mg/dL Final   Calcium 10/93/2355 8.8 (A) 8.9 - 10.3 mg/dL Final   Total Protein 73/22/0254 6.4 (A) 6.5 - 8.1 g/dL Final   Albumin 27/05/2375 3.4 (A) 3.5 - 5.0 g/dL Final   AST 28/31/5176 22  15 - 41 U/L Final   ALT 06/05/2021 11  0 - 44 U/L Final   Alkaline Phosphatase 06/05/2021 63  38 - 126 U/L Final   Total Bilirubin 06/05/2021 0.5  0.3 - 1.2 mg/dL Final   GFR, Estimated 06/05/2021 >60  >60 mL/min Final   Comment: (NOTE) Calculated using the CKD-EPI Creatinine Equation (2021)    Anion gap 06/05/2021 10  5 - 15 Final   Performed at [facility], [address] [place], [place] [address]   WBC 06/05/2021 7.1  4.0 - 10.5 K/uL Final   RBC 06/05/2021 4.84  3.87 - 5.11 MIL/uL Final   Hemoglobin 06/05/2021 13.5  12.0 - 15.0 g/dL Final   HCT 60/45/4098 42.3  36.0 - 46.0 % Final   MCV 06/05/2021 87.4  80.0 - 100.0 fL Final   MCH 06/05/2021 27.9  26.0 - 34.0 pg Final   MCHC 06/05/2021 31.9  30.0 - 36.0 g/dL Final   RDW 11/91/4782 14.3  11.5 - 15.5 % Final   Platelets 06/05/2021 340  150 - 400 K/uL Final   nRBC 06/05/2021 0.0  0.0 - 0.2 % Final   Performed at [facility], [address], [place], [place] [address]   Color, Urine 06/05/2021 STRAW (A) YELLOW Final   APPearance 06/05/2021 CLEAR  CLEAR Final   Specific Gravity, Urine 06/05/2021 1.003 (A) 1.005 - 1.030 Final   pH 06/05/2021 6.0  5.0 - 8.0 Final   Glucose, UA 06/05/2021 NEGATIVE  NEGATIVE mg/dL Final   Hgb urine dipstick 06/05/2021 LARGE (A) NEGATIVE Final   Bilirubin Urine 06/05/2021 NEGATIVE  NEGATIVE Final   Ketones, ur 06/05/2021 NEGATIVE  NEGATIVE mg/dL Final   Protein, ur 30/86/5784 NEGATIVE  NEGATIVE mg/dL Final   Nitrite 69/62/9528 NEGATIVE  NEGATIVE Final   Leukocytes,Ua 06/05/2021 MODERATE (A) NEGATIVE Final   RBC / HPF 06/05/2021 0-5  0 - 5 RBC/hpf Final   WBC, UA 06/05/2021 6-10  0 - 5 WBC/hpf Final   Bacteria, UA 06/05/2021 RARE (A) NONE SEEN Final   Squamous Epithelial  / LPF 06/05/2021 0-5  0 - 5 Final   Mucus 06/05/2021 PRESENT   Final   Performed at [facility], [address] [place], [place] [address]   I-stat hCG, quantitative 06/05/2021 <5.0  <5 mIU/mL Final   Comment 3 06/05/2021          Final   Comment:   GEST. AGE      CONC.  (mIU/mL)   <=1 WEEK        5 - 50     2 WEEKS       50 - 500     3 WEEKS       100 - 10,000     4 WEEKS     1,000 - 30,000        FEMALE AND NON-PREGNANT FEMALE:     LESS THAN 5 mIU/mL   Admission on 03/03/2021, Discharged on 03/03/2021  Component Date Value Ref Range Status   Group A Strep by PCR 03/03/2021 NOT DETECTED  NOT DETECTED Final   Performed at [facility], [address] [place], [place] [address]    Allergies: Patient has no known allergies.  PTA Medications: (Not in a hospital admission)   Medical Decision Making  Patient admitted to [facility] for stabilization and treatment of worsening depression and anxiety.  -lexapro /day for depression -Vistaril  Q6hr prn for anxiety -Trazodone  QHS prn for sleep -obtain and review lab results  Recommendations  Based on my evaluation the patient does not appear to have an emergency medical condition.  Maricela Bo, NP 07/29/21  2:16 AM

## 2021-07-29 NOTE — ED Notes (Signed)
Pt transferred to [facility] unit, A&O x 4, gait steady, no distress.  Pt oriented to rules of unit, comfort measures given.  Monitoring for safety.

## 2021-07-29 NOTE — Progress Notes (Signed)
Patient woke for late breakfast.  Teary.  Anxious.  Down to day room for muffin and juice.  Out to courtyard with staff.

## 2021-07-29 NOTE — ED Provider Notes (Addendum)
[facility] Admission Suicide Risk Assessment   Nursing information obtained from:   chart  Demographic factors:   low socioeconomic status, african [place], young adult Current Mental Status:   see subjective data below  Loss Factors:  loss of transportation, low socioeconomic status  Historical Factors:   hx of depression and anxiety Risk Reduction Factors:   responsibility to family, future thinking  Total Time spent with patient: 30 minutes Principal Problem: <principal problem not specified> Diagnosis:  Active Problems:   MDD (major depressive disorder), severe (HCC)  Subjective Data: On today's evaluation, patient is sitting in her bed.  She makes minimal eye contact.  Her speech is clear coherent and decreased.  She is alert and oriented x 4.  Endorses depression and anxiety with a congruent affect.  Reports she sleeps 1 hour per night and has a decreased appetite.  Reports her anxiety makes her unable to sleep.  States, "I feel like I am getting down my sleep".  States due to her anxiety and depression she isolates herself.  She feels hopeless at times.  States she has to depend on other people for transportation and does not feel like she is in control of her own life.  Endorses passive suicidal ideation.  States "I do not want to die but sometimes I think a final wake up it would solve the problem".  Denies intent, plan, access to means. Patient denied homicidal/self-harm ideation, psychosis, and paranoia.  Discussed as needed trazodone for sleep, and hydroxyzine for anxiety.  Restarted patient's Lexapro 10 mg daily and will initiate BuSpar 10 mg p.o. twice daily. Reassurance, support, and encouragement provided. Patient's feelings and discussed.    Continued Clinical Symptoms:    The "Alcohol Use Disorders Identification Test", Guidelines for Use in Primary Care, Second Edition.  World Science writer [facility]). Score between 0-7:  no or low risk or alcohol related problems. Score between  8-15:  moderate risk of alcohol related problems. Score between 16-19:  high risk of alcohol related problems. Score 20 or above:  warrants further diagnostic evaluation for alcohol dependence and treatment.   CLINICAL FACTORS:   Severe Anxiety and/or Agitation Depression:   Hopelessness   Musculoskeletal: Strength & Muscle Tone: within normal limits Gait & Station: normal Patient leans: N/A  Psychiatric Specialty Exam:  Presentation  General Appearance: Appropriate for Environment; Casual  Eye Contact:Minimal  Speech:Clear and Coherent; Normal Rate  Speech Volume:Decreased  Handedness:Right   Mood and Affect  Mood:Anxious; Depressed  Affect:Congruent; Depressed   Thought Process  Thought Processes:Coherent  Descriptions of Associations:Intact  Orientation:Full (Time, Place and Person)  Thought Content:Logical  History of Schizophrenia/Schizoaffective disorder:No  Duration of Psychotic Symptoms:No data recorded Hallucinations:Hallucinations: None  Ideas of Reference:None  Suicidal Thoughts:Suicidal Thoughts: Yes, Passive SI Passive Intent and/or Plan: Without Intent; Without Plan; Without Means to Carry Out  Homicidal Thoughts:Homicidal Thoughts: No   Sensorium  Memory:Immediate Good; Recent Good; Remote Good  Judgment:Good  Insight:Good   Executive Functions  Concentration:Good  Attention Span:Good  Recall:Good  Fund of Knowledge:Good  Language:Good   Psychomotor Activity  Psychomotor Activity:Psychomotor Activity: Normal   Assets  Assets:Communication Skills; Desire for Improvement; Financial Resources/Insurance; Housing; Physical Health; Resilience; Social Support; Vocational/Educational   Sleep  Sleep:Sleep: Poor Number of Hours of Sleep: 1    Physical Exam: Physical Exam see progress note for today  ROS see progress note for today  Blood pressure 114/88, pulse 68, temperature 97.7 F (36.5 C), temperature source  Tympanic, resp. rate 18, SpO2 100 %.  There is no height or weight on file to calculate BMI.   COGNITIVE FEATURES THAT CONTRIBUTE TO RISK:  None    SUICIDE RISK:   Mild:  Suicidal ideation of limited frequency, intensity, duration, and specificity.  There are no identifiable plans, no associated intent, mild dysphoria and related symptoms, good self-control (both objective and subjective assessment), few other risk factors, and identifiable protective factors, including available and accessible social support.  PLAN OF CARE:  Patient admitted to [facility] for stabilization and treatment of worsening depression and anxiety.   Daily contact with patient to assess and evaluate symptoms and progress in treatment and Medication management  Lexapro 10 mg home medication restarted, Buspar PO 10 mg BID started  -Vistaril 25mg  Q6hr prn for anxiety -Trazodone 50mg  QHS prn for sleep  Labwork ordered and reviewed.uds + thc EKG on 07/27/2021 QT/QTC 398/394   Lexapro 10 mg home medication restarted, Buspar PO 10 mg BID started   I certify that inpatient services furnished can reasonably be expected to improve the patient's condition.   , NP 07/29/2021, 5:24 PM

## 2021-07-29 NOTE — ED Notes (Signed)
Pt has no belongings/belonging sheet.

## 2021-07-29 NOTE — ED Notes (Signed)
Pt A&O x 4, no distress noted, calm & cooperative, presents with anxiety and depression increasing in severity for past few weeks.  Denies SI, HI or AVH.  Denies previous SI attempts. Monitoring for safety.

## 2021-07-29 NOTE — ED Provider Notes (Addendum)
Behavioral Health Progress Note  Date and Time: 07/29/2021 9:07 PM Name: Katelyn Parker MRN:  [id number]  Subjective:   Katelyn Parker is a 25y/o female. Patient presented voluntarily to [facility] for evaluation of worsening anxiety and depression. She was admitted to the [facility].     [name], 25 y.o., female patient seen face to face by this provider, chart reviewed and discussed with treatment team and Dr.Kumar on 07/29/21.     On today's evaluation, patient is sitting in her bed.  She makes minimal eye contact.  Her speech is clear coherent and decreased.  She is alert and oriented x 4.  Endorses depression and anxiety with a congruent affect.  Reports she sleeps 1 hour per night and has a decreased appetite.  Reports her anxiety makes her unable to sleep.  States, "I feel like I am going to die in my sleep".  States due to her anxiety and depression she isolates herself.  She feels hopeless at times.  States she has to depend on other people for transportation and does not feel like she is in control of her own life.  Endorses passive suicidal ideation.  States "I do not want to die but sometimes I think if I don't wake up it would solve the problem".  Denies intent, plan, access to means. Patient denied homicidal/self-harm ideation, psychosis, and paranoia.  Discussed as needed trazodone for sleep, and hydroxyzine for anxiety.  Restarted patient's Lexapro 10 mg daily and will initiate BuSpar 10 mg p.o. twice daily. Reassurance, support, and encouragement provided. Patient's feelings and discussed.     Diagnosis:  Final diagnoses:  Severe episode of recurrent major depressive disorder, without psychotic features (HCC)  Generalized anxiety disorder    Total Time spent with patient: 30 minutes  Past Psychiatric History: anxiety and depression  Past Medical History: History reviewed. No pertinent past medical history. History reviewed. No pertinent surgical history. Family History: History  reviewed. No pertinent family history. Family Psychiatric  History: unknown Social History:  Social History   Substance and Sexual Activity  Alcohol Use Yes     Social History   Substance and Sexual Activity  Drug Use Not Currently    Social History   Socioeconomic History   Marital status: Single    Spouse name: Not on file   Number of children: Not on file   Years of education: Not on file   Highest education level: Not on file  Occupational History   Not on file  Tobacco Use   Smoking status: Some Days   Smokeless tobacco: Never  Vaping Use   Vaping Use: Never used  Substance and Sexual Activity   Alcohol use: Yes   Drug use: Not Currently   Sexual activity: Not on file  Other Topics Concern   Not on file  Social History Narrative   Not on file   Social Determinants of Health   Financial Resource Strain: Not on file  Food Insecurity: Not on file  Transportation Needs: Not on file  Physical Activity: Not on file  Stress: Not on file  Social Connections: Not on file   SDOH:  SDOH Screenings   Alcohol Screen: Not on file  Depression (PHQ2-9): Medium Risk   PHQ-2 Score: 15  Financial Resource Strain: Not on file  Food Insecurity: Not on file  Housing: Not on file  Physical Activity: Not on file  Social Connections: Not on file  Stress: Not on file  Tobacco Use: High Risk  Smoking Tobacco Use: Some Days   Smokeless Tobacco Use: Never  Transportation Needs: Not on file   Additional Social History:    Pain Medications: see MAR Prescriptions: see MAR Over the Counter: see MAR History of alcohol / drug use?: Yes Name of Substance 1: etoh 1 - Age of First Use: undisclosed 1 - Amount (size/oz): variable 1 - Frequency: rarely 1 - Last Use / Amount: several days ago 1 - Method of Aquiring: store 1- Route of Use: oral/drink Name of Substance 2: THC 2 - Age of First Use: undisclosed 2 - Amount (size/oz): variable 2 - Frequency: socially 2 - Last  Use / Amount: "its been quite a while" 2 - Method of Aquiring: streets 2 - Route of Substance Use: oral/smoke    Sleep: Poor  Appetite:  Fair  Current Medications:  Current Facility-Administered Medications  Medication Dose Route Frequency Provider Last Rate Last Admin   acetaminophen (TYLENOL) tablet 650 mg  650 mg Oral Q6H PRN Ajibola, Ene A, NP   650 mg at 07/29/21 2018   alum & mag hydroxide-simeth (MAALOX/MYLANTA) 200-200-20 MG/5ML suspension 30 mL  30 mL Oral Q4H PRN Ajibola, Ene A, NP       busPIRone (BUSPAR) tablet 10 mg  10 mg Oral BID Ardis Hughs, NP       escitalopram (LEXAPRO) tablet 10 mg  10 mg Oral Daily Ardis Hughs, NP   10 mg at 07/29/21 1218   hydrOXYzine (ATARAX/VISTARIL) tablet 25 mg  25 mg Oral TID PRN Ajibola, Ene A, NP   25 mg at 07/29/21 2015   magnesium hydroxide (MILK OF MAGNESIA) suspension 30 mL  30 mL Oral Daily PRN Ajibola, Ene A, NP       traZODone (DESYREL) tablet 50 mg  50 mg Oral QHS PRN Ajibola, Ene A, NP       Current Outpatient Medications  Medication Sig Dispense Refill   diphenhydramine-acetaminophen (TYLENOL PM) 25-500 MG TABS tablet Take 1-2 tablets by mouth at bedtime as needed (sleep/pain).     escitalopram (LEXAPRO) 10 MG tablet Take 1 tablet (10 mg total) by mouth daily. 30 tablet 1   fluconazole (DIFLUCAN) 150 MG tablet Take 1 tablet (150 mg total) by mouth daily. (Patient not taking: No sig reported) 1 tablet 0   metroNIDAZOLE (FLAGYL) 500 MG tablet Take 1 tablet (500 mg total) by mouth 2 (two) times daily. 14 tablet 0   ondansetron (ZOFRAN ODT) 4 MG disintegrating tablet Take 1 tablet (4 mg total) by mouth every 8 (eight) hours as needed for nausea or vomiting. (Patient not taking: No sig reported) 12 tablet 0    Labs  Lab Results:  Admission on 07/28/2021  Component Date Value Ref Range Status   SARS Coronavirus 2 by RT PCR 07/28/2021 NEGATIVE  NEGATIVE Final   Comment: (NOTE) SARS-CoV-2 target nucleic acids are NOT  DETECTED.  The SARS-CoV-2 RNA is generally detectable in upper respiratory specimens during the acute phase of infection. The lowest concentration of SARS-CoV-2 viral copies this assay can detect is 138 copies/mL. A negative result does not preclude SARS-Cov-2 infection and should not be used as the sole basis for treatment or other patient management decisions. A negative result may occur with  improper specimen collection/handling, submission of specimen other than nasopharyngeal swab, presence of viral mutation(s) within the areas targeted by this assay, and inadequate number of viral copies(<138 copies/mL). A negative result must be combined with clinical observations, patient history, and epidemiological information. The  expected result is Negative.  Fact Sheet for Patients:  BloggerCourse.com  Fact Sheet for Healthcare Providers:  SeriousBroker.it  This test is no                          t yet approved or cleared by the [place] FDA and  has been authorized for detection and/or diagnosis of SARS-CoV-2 by FDA under an Emergency Use Authorization (EUA). This EUA will remain  in effect (meaning this test can be used) for the duration of the COVID-19 declaration under Section 564(b)(1) of the Act, 21 U.S.C.section 360bbb-3(b)(1), unless the authorization is terminated  or revoked sooner.       Influenza A by PCR 07/28/2021 NEGATIVE  NEGATIVE Final   Influenza B by PCR 07/28/2021 NEGATIVE  NEGATIVE Final   Comment: (NOTE) The Xpert Xpress SARS-CoV-2/FLU/RSV plus assay is intended as an aid in the diagnosis of influenza from Nasopharyngeal swab specimens and should not be used as a sole basis for treatment. Nasal washings and aspirates are unacceptable for Xpert Xpress SARS-CoV-2/FLU/RSV testing.  Fact Sheet for Patients: BloggerCourse.com  Fact Sheet for Healthcare  Providers: SeriousBroker.it  This test is not yet approved or cleared by the [place] FDA and has been authorized for detection and/or diagnosis of SARS-CoV-2 by FDA under an Emergency Use Authorization (EUA). This EUA will remain in effect (meaning this test can be used) for the duration of the COVID-19 declaration under Section 564(b)(1) of the Act, 21 U.S.C. section 360bbb-3(b)(1), unless the authorization is terminated or revoked.  Performed at [facility], [address], [place], [place] [address]    TSH 07/28/2021 2.039  0.350 - 4.500 uIU/mL Final   Comment: Performed by a 3rd Generation assay with a functional sensitivity of <=0.01 uIU/mL. Performed at [facility], [address] [place], [place] [address]    POC Amphetamine UR 07/28/2021 None Detected  NONE DETECTED (Cut Off Level 1000 ng/mL) Final   POC Secobarbital (BAR) 07/28/2021 None Detected  NONE DETECTED (Cut Off Level 300 ng/mL) Final   POC Buprenorphine (BUP) 07/28/2021 None Detected  NONE DETECTED (Cut Off Level 10 ng/mL) Final   POC Oxazepam (BZO) 07/28/2021 None Detected  NONE DETECTED (Cut Off Level 300 ng/mL) Final   POC Cocaine UR 07/28/2021 None Detected  NONE DETECTED (Cut Off Level 300 ng/mL) Final   POC Methamphetamine UR 07/28/2021 None Detected  NONE DETECTED (Cut Off Level 1000 ng/mL) Final   POC Morphine 07/28/2021 None Detected  NONE DETECTED (Cut Off Level 300 ng/mL) Final   POC Oxycodone UR 07/28/2021 None Detected  NONE DETECTED (Cut Off Level 100 ng/mL) Final   POC Methadone UR 07/28/2021 None Detected  NONE DETECTED (Cut Off Level 300 ng/mL) Final   POC Marijuana UR 07/28/2021 Positive (A) NONE DETECTED (Cut Off Level 50 ng/mL) Final   Preg Test, Ur 07/28/2021 NEGATIVE  NEGATIVE Final   Comment:        THE SENSITIVITY OF THIS METHODOLOGY IS >24 mIU/mL    SARS Coronavirus 2 Ag 07/28/2021 Negative  Negative Final   SARSCOV2ONAVIRUS 2 AG 07/28/2021  NEGATIVE  NEGATIVE Final   Comment: (NOTE) SARS-CoV-2 antigen NOT DETECTED.   Negative results are presumptive.  Negative results do not preclude SARS-CoV-2 infection and should not be used as the sole basis for treatment or other patient management decisions, including infection  control decisions, particularly in the presence of clinical signs and  symptoms consistent with  COVID-19, or in those who have been in contact with the virus.  Negative results must be combined with clinical observations, patient history, and epidemiological information. The expected result is Negative.  Fact Sheet for Patients: https://www.jennings-kim.com/  Fact Sheet for Healthcare Providers: https://alexander-rogers.biz/  This test is not yet approved or cleared by the [place] FDA and  has been authorized for detection and/or diagnosis of SARS-CoV-2 by FDA under an Emergency Use Authorization (EUA).  This EUA will remain in effect (meaning this test can be used) for the duration of  the COV                          ID-19 declaration under Section 564(b)(1) of the Act, 21 U.S.C. section 360bbb-3(b)(1), unless the authorization is terminated or revoked sooner.    Admission on 07/27/2021, Discharged on 07/27/2021  Component Date Value Ref Range Status   Sodium 07/27/2021 137  135 - 145 mmol/L Final   Potassium 07/27/2021 3.7  3.5 - 5.1 mmol/L Final   Chloride 07/27/2021 107  98 - 111 mmol/L Final   CO2 07/27/2021 22  22 - 32 mmol/L Final   Glucose, Bld 07/27/2021 95  70 - 99 mg/dL Final   Glucose reference range applies only to samples taken after fasting for at least 8 hours.   BUN 07/27/2021 8  6 - 20 mg/dL Final   Creatinine, Ser 07/27/2021 0.87  0.44 - 1.00 mg/dL Final   Calcium 16/09/9603 9.0  8.9 - 10.3 mg/dL Final   GFR, Estimated 07/27/2021 >60  >60 mL/min Final   Comment: (NOTE) Calculated using the CKD-EPI Creatinine Equation (2021)    Anion gap 07/27/2021  8  5 - 15 Final   Performed at [facility], [address], [place], [place] [address]   WBC 07/27/2021 5.7  4.0 - 10.5 K/uL Final   RBC 07/27/2021 4.81  3.87 - 5.11 MIL/uL Final   Hemoglobin 07/27/2021 13.2  12.0 - 15.0 g/dL Final   HCT 11/91/4782 41.6  36.0 - 46.0 % Final   MCV 07/27/2021 86.5  80.0 - 100.0 fL Final   MCH 07/27/2021 27.4  26.0 - 34.0 pg Final   MCHC 07/27/2021 31.7  30.0 - 36.0 g/dL Final   RDW 95/62/1308 13.4  11.5 - 15.5 % Final   Platelets 07/27/2021 286  150 - 400 K/uL Final   nRBC 07/27/2021 0.0  0.0 - 0.2 % Final   Performed at [facility], [address], [place], [place] [address]   Troponin I (High Sensitivity) 07/27/2021 4  <18 ng/L Final   Comment: (NOTE) Elevated high sensitivity troponin I (hsTnI) values and significant  changes across serial measurements may suggest ACS but many other  chronic and acute conditions are known to elevate hsTnI results.  Refer to the "Links" section for chest pain algorithms and additional  guidance. Performed at [facility], [address], [place], [place] [address]    I-stat hCG, quantitative 07/27/2021 <5.0  <5 mIU/mL Final   Comment 3 07/27/2021          Final   Comment:   GEST. AGE      CONC.  (mIU/mL)   <=1 WEEK        5 - 50     2 WEEKS       50 - 500     3 WEEKS       100 - 10,000  4 WEEKS     1,000 - 30,000        FEMALE AND NON-PREGNANT FEMALE:     LESS THAN 5 mIU/mL    Troponin I (High Sensitivity) 07/27/2021 3  <18 ng/L Final   Comment: (NOTE) Elevated high sensitivity troponin I (hsTnI) values and significant  changes across serial measurements may suggest ACS but many other  chronic and acute conditions are known to elevate hsTnI results.  Refer to the "Links" section for chest pain algorithms and additional  guidance. Performed at [facility], [address], [place], [place] [address]   Admission on 07/19/2021, Discharged on 07/19/2021  Component Date Value Ref  Range Status   Color, Urine 07/19/2021 YELLOW  YELLOW Final   APPearance 07/19/2021 HAZY (A) CLEAR Final   Specific Gravity, Urine 07/19/2021 1.021  1.005 - 1.030 Final   pH 07/19/2021 6.0  5.0 - 8.0 Final   Glucose, UA 07/19/2021 NEGATIVE  NEGATIVE mg/dL Final   Hgb urine dipstick 07/19/2021 NEGATIVE  NEGATIVE Final   Bilirubin Urine 07/19/2021 NEGATIVE  NEGATIVE Final   Ketones, ur 07/19/2021 NEGATIVE  NEGATIVE mg/dL Final   Protein, ur 62/13/0865 NEGATIVE  NEGATIVE mg/dL Final   Nitrite 78/46/9629 NEGATIVE  NEGATIVE Final   Leukocytes,Ua 07/19/2021 SMALL (A) NEGATIVE Final   RBC / HPF 07/19/2021 0-5  0 - 5 RBC/hpf Final   WBC, UA 07/19/2021 11-20  0 - 5 WBC/hpf Final   Bacteria, UA 07/19/2021 RARE (A) NONE SEEN Final   Squamous Epithelial / LPF 07/19/2021 0-5  0 - 5 Final   Mucus 07/19/2021 PRESENT   Final   Performed at [facility], [address], [place], [place] [address]   Preg Test, Ur 07/19/2021 NEGATIVE  NEGATIVE Final   Comment:        THE SENSITIVITY OF THIS METHODOLOGY IS >20 mIU/mL. Performed at [facility], [address], [place], [place] [address]    Yeast Wet Prep HPF POC 07/19/2021 NONE SEEN  NONE SEEN Final   Swab received with less than 0.5 mL of saline, saline added to specimen, interpret results with caution.   Trich, Wet Prep 07/19/2021 PRESENT (A) NONE SEEN Final   Clue Cells Wet Prep HPF POC 07/19/2021 PRESENT (A) NONE SEEN Final   WBC, Wet Prep HPF POC 07/19/2021 MANY (A) NONE SEEN Final   Sperm 07/19/2021 NONE SEEN   Final   Performed at [facility], [address] [place], [place] [address]   Neisseria Gonorrhea 07/19/2021 Negative   Final   Chlamydia 07/19/2021 Positive (A)  Final   Comment 07/19/2021 Normal Reference Ranger Chlamydia - Negative   Final   Comment 07/19/2021 Normal Reference Range Neisseria Gonorrhea - Negative   Final  Admission on 06/05/2021, Discharged on 06/05/2021  Component Date Value Ref Range Status    Lipase 06/05/2021 24  11 - 51 U/L Final   Performed at [facility], [address] [place], [place] [address]   Sodium 06/05/2021 137  135 - 145 mmol/L Final   Potassium 06/05/2021 3.5  3.5 - 5.1 mmol/L Final   Chloride 06/05/2021 105  98 - 111 mmol/L Final   CO2 06/05/2021 22  22 - 32 mmol/L Final   Glucose, Bld 06/05/2021 132 (A) 70 - 99 mg/dL Final   Glucose reference range applies only to samples taken after fasting for at least 8 hours.   BUN 06/05/2021 11  6 - 20 mg/dL Final  Creatinine, Ser 06/05/2021 0.96  0.44 - 1.00 mg/dL Final   Calcium 09/81/191407/04/2021 8.8 (A) 8.9 - 10.3 mg/dL Final   Total Protein 78/29/562107/04/2021 6.4 (A) 6.5 - 8.1 g/dL Final   Albumin 30/86/578407/04/2021 3.4 (A) 3.5 - 5.0 g/dL Final   AST 69/62/952807/04/2021 22  15 - 41 U/L Final   ALT 06/05/2021 11  0 - 44 U/L Final   Alkaline Phosphatase 06/05/2021 63  38 - 126 U/L Final   Total Bilirubin 06/05/2021 0.5  0.3 - 1.2 mg/dL Final   GFR, Estimated 06/05/2021 >60  >60 mL/min Final   Comment: (NOTE) Calculated using the CKD-EPI Creatinine Equation (2021)    Anion gap 06/05/2021 10  5 - 15 Final   Performed at [facility], [address] [place], [place] [address]   WBC 06/05/2021 7.1  4.0 - 10.5 K/uL Final   RBC 06/05/2021 4.84  3.87 - 5.11 MIL/uL Final   Hemoglobin 06/05/2021 13.5  12.0 - 15.0 g/dL Final   HCT 40/10/272507/04/2021 42.3  36.0 - 46.0 % Final   MCV 06/05/2021 87.4  80.0 - 100.0 fL Final   MCH 06/05/2021 27.9  26.0 - 34.0 pg Final   MCHC 06/05/2021 31.9  30.0 - 36.0 g/dL Final   RDW 36/64/403407/04/2021 14.3  11.5 - 15.5 % Final   Platelets 06/05/2021 340  150 - 400 K/uL Final   nRBC 06/05/2021 0.0  0.0 - 0.2 % Final   Performed at [facility], [address] [place], [place] [address]   Color, Urine 06/05/2021 STRAW (A) YELLOW Final   APPearance 06/05/2021 CLEAR  CLEAR Final   Specific Gravity, Urine 06/05/2021 1.003 (A) 1.005 - 1.030 Final   pH 06/05/2021 6.0  5.0 - 8.0 Final   Glucose, UA 06/05/2021 NEGATIVE   NEGATIVE mg/dL Final   Hgb urine dipstick 06/05/2021 LARGE (A) NEGATIVE Final   Bilirubin Urine 06/05/2021 NEGATIVE  NEGATIVE Final   Ketones, ur 06/05/2021 NEGATIVE  NEGATIVE mg/dL Final   Protein, ur 56/38/756407/04/2021 NEGATIVE  NEGATIVE mg/dL Final   Nitrite 33/29/518807/04/2021 NEGATIVE  NEGATIVE Final   Leukocytes,Ua 06/05/2021 MODERATE (A) NEGATIVE Final   RBC / HPF 06/05/2021 0-5  0 - 5 RBC/hpf Final   WBC, UA 06/05/2021 6-10  0 - 5 WBC/hpf Final   Bacteria, UA 06/05/2021 RARE (A) NONE SEEN Final   Squamous Epithelial / LPF 06/05/2021 0-5  0 - 5 Final   Mucus 06/05/2021 PRESENT   Final   Performed at [facility], [address] [place], [place] [address]   I-stat hCG, quantitative 06/05/2021 <5.0  <5 mIU/mL Final   Comment 3 06/05/2021          Final   Comment:   GEST. AGE      CONC.  (mIU/mL)   <=1 WEEK        5 - 50     2 WEEKS       50 - 500     3 WEEKS       100 - 10,000     4 WEEKS     1,000 - 30,000        FEMALE AND NON-PREGNANT FEMALE:     LESS THAN 5 mIU/mL   Admission on 03/03/2021, Discharged on 03/03/2021  Component Date Value Ref Range Status   Group A Strep by PCR 03/03/2021 NOT DETECTED  NOT DETECTED Final   Performed at [facility], [address] [place], [place] [address]  Blood Alcohol level:  No results found for: [facility]  Metabolic Disorder Labs: No results found for: HGBA1C, MPG No results found for: PROLACTIN No results found for: CHOL, TRIG, HDL, CHOLHDL, VLDL, LDLCALC  Therapeutic Lab Levels: No results found for: LITHIUM No results found for: VALPROATE No components found for:  CBMZ  Physical Findings   GAD-7    Flowsheet Row [facility]  Total GAD-7 Score 21      PHQ2-9    Flowsheet Row Office Visit [facility] 07/23/2021 in [facility]  PHQ-2 Total Score 4  PHQ-9 Total Score 15      Flowsheet Row ED  from 07/28/2021 in [facility] ED from 07/27/2021 in [facility] EMERGENCY DEPARTMENT ED from 07/19/2021 in [facility] EMERGENCY DEPARTMENT  C-SSRS RISK CATEGORY No Risk No Risk No Risk        Musculoskeletal  Strength & Muscle Tone: within normal limits Gait & Station: normal Patient leans: N/A  Psychiatric Specialty Exam  Presentation  General Appearance: Appropriate for Environment; Casual  Eye Contact:Minimal  Speech:Clear and Coherent; Normal Rate  Speech Volume:Decreased  Handedness:Right   Mood and Affect  Mood:Anxious; Depressed  Affect:Congruent; Depressed   Thought Process  Thought Processes:Coherent  Descriptions of Associations:Intact  Orientation:Full (Time, Place and Person)  Thought Content:Logical  Diagnosis of Schizophrenia or Schizoaffective disorder in past: No    Hallucinations:Hallucinations: None  Ideas of Reference:None  Suicidal Thoughts:Suicidal Thoughts: Yes, Passive SI Passive Intent and/or Plan: Without Intent; Without Plan; Without Means to Carry Out  Homicidal Thoughts:Homicidal Thoughts: No   Sensorium  Memory:Immediate Good; Recent Good; Remote Good  Judgment:Good  Insight:Good   Executive Functions  Concentration:Good  Attention Span:Good  Recall:Good  Fund of Knowledge:Good  Language:Good   Psychomotor Activity  Psychomotor Activity:Psychomotor Activity: Normal   Assets  Assets:Communication Skills; Desire for Improvement; Financial Resources/Insurance; Housing; Physical Health; Resilience; Social Support; Vocational/Educational   Sleep  Sleep:Sleep: Poor Number of Hours of Sleep: 1   Nutritional Assessment (For OBS and FBC admissions only) Has the patient had a weight loss or gain of 10 pounds or more in the last 3 months?: No Has the patient had a decrease in food intake/or appetite?: No Does the patient have dental problems?: No Does  the patient have eating habits or behaviors that may be indicators of an eating disorder including binging or inducing vomiting?: No Has the patient recently lost weight without trying?: No Has the patient been eating poorly because of a decreased appetite?: No Malnutrition Screening Tool Score: 0   Physical Exam  Physical Exam Vitals and nursing note reviewed.  Constitutional:      General: She is not in acute distress.    Appearance: Normal appearance. She is not ill-appearing.  HENT:     Head: Normocephalic.  Eyes:     General:        Right eye: No discharge.        Left eye: No discharge.     Conjunctiva/sclera: Conjunctivae normal.  Cardiovascular:     Rate and Rhythm: Normal rate.  Pulmonary:     Effort: Pulmonary effort is normal.  Musculoskeletal:        General: Normal range of motion.     Cervical back: Normal range of motion.  Skin:    Coloration: Skin is not jaundiced or pale.  Neurological:  Mental Status: She is alert and oriented to person, place, and time.  Psychiatric:        Attention and Perception: Attention and perception normal.        Mood and Affect: Affect normal. Mood is anxious and depressed.        Speech: Speech normal.        Behavior: Behavior normal. Behavior is cooperative.        Thought Content: Thought content includes suicidal ideation. Thought content does not include suicidal plan.        Cognition and Memory: Cognition normal.        Judgment: Judgment normal.   Review of Systems  Constitutional: Negative.  Negative for chills and fever.  HENT: Negative.  Negative for hearing loss.   Eyes: Negative.   Respiratory: Negative.  Negative for cough.   Cardiovascular: Negative.  Negative for chest pain.  Musculoskeletal: Negative.   Skin: Negative.   Neurological: Negative.   Psychiatric/Behavioral:  Positive for depression and suicidal ideas. The patient is nervous/anxious.   Blood pressure 125/83, pulse 76, temperature 99.2 F  (37.3 C), temperature source Oral, resp. rate 14, SpO2 100 %. There is no height or weight on file to calculate BMI.  Treatment Plan Summary: Daily contact with patient to assess and evaluate symptoms and progress in treatment and Medication management  Patient continues to meet criteria for treatment in the [facility].  Labwork reviewed uds + thc EKG on 07/27/2021 QT/QTC 398/394  Lexapro 10 mg home medication restarted, Buspar PO 10 mg BID started   Ardis Hughs, NP 07/29/2021 9:07 PM

## 2021-07-29 NOTE — Progress Notes (Signed)
Katelyn Parker was received in the dining room, she ate half of her muffin and drink her apple juice. Afterwards we went to the courtyard, talked and completed her safety plan. She described her anxiety and panic attacks over the past several weeks crippling. She remained tearful throughout our one to one conversation. She has passive suicidal thoughts, but denied a plan. She endorsed wanting to live with short and long term goals. She has a good support system. She was medicated with vistaril, requested her Lexapro and made a phone call.

## 2021-07-29 NOTE — ED Notes (Signed)
Patient was resting in her rom at the beginning of the shift. She complained of headache 7 out of 10 and anxiety. She was given PRN Tylenol 650 mgs about 2030 and 25 mg Vistaril. Patient spoke extensively about her anxiety and her inability to control her anxiety. "I have always been the one that everyone comes to with their problems".

## 2021-07-29 NOTE — ED Notes (Signed)
Pt sleeping at present, no distress noted.  Monitoring for safety. 

## 2021-07-29 NOTE — Progress Notes (Signed)
Patient out in dayroom watching television.  Given sandwich and chips per patient request.

## 2021-07-29 NOTE — ED Notes (Signed)
Patient appears to be sleeping.  Respirations even and unlabored.  Continue to monitor for safety. °

## 2021-07-30 NOTE — ED Notes (Signed)
PT is now in room, sitting in bed, she just took her prn medications, respirations are even/unlabored, environment check complete/secure, will continue to monitor patient for safety

## 2021-07-30 NOTE — ED Notes (Signed)
Patient interacted and communicated well in wrap up group. Patient was able to discuss ways of using her coping skills when feeling anxious. Staff encouraged patient continue positive and upbeat attitude she displayed throughout the day.

## 2021-07-30 NOTE — ED Notes (Signed)
Patient was able to sleep through most of the night. She stated that it took her a little while to get to sleep but she was able to sleep.

## 2021-07-30 NOTE — BH Specialist Note (Deleted)
Integrated Behavioral Health via Telemedicine Visit  07/30/2021 Katelyn Parker [id number]  Number of Integrated Behavioral Health visits: *** Session Start time: 10:15***  Session End time: 10:45*** Total time: {IBH Total Time:21014050}  Referring Provider: *** Patient/Family location: Home*** [facility] Provider location: [facility]  All persons participating in visit: Patient *** and [facility] Katelyn Parker ***  Types of Service: {CHL AMB TYPE OF [phone]}  I connected with Katelyn Parker and/or Katelyn Parker's {family members:20773} via  Telephone or Video Enabled Telemedicine Application  (Video is Caregility application) and verified that I am speaking with the correct person using two identifiers. Discussed confidentiality: {YES/NO:21197}  I discussed the limitations of telemedicine and the availability of in person appointments.  Discussed there is a possibility of technology failure and discussed alternative modes of communication if that failure occurs.  I discussed that engaging in this telemedicine visit, they consent to the provision of behavioral healthcare and the services will be billed under their insurance.  Patient and/or legal guardian expressed understanding and consented to Telemedicine visit: {YES/NO:21197}  Presenting Concerns: Patient and/or family reports the following symptoms/concerns: *** Duration of problem: ***; Severity of problem: {Mild/Moderate/Severe:20260}  Patient and/or Family's Strengths/Protective Factors: {CHL AMB BH PROTECTIVE FACTORS:[phone]}  Goals Addressed: Patient will:  Reduce symptoms of: {IBH Symptoms:21014056}   Increase knowledge and/or ability of: {IBH Patient Tools:21014057}   Demonstrate ability to: {IBH Goals:21014053}  Progress towards Goals: {CHL AMB BH PROGRESS TOWARDS GOALS:[phone]}  Interventions: Interventions utilized:  {IBH  Interventions:21014054} Standardized Assessments completed: {IBH Screening Tools:21014051}  Patient and/or Family Response: ***  Assessment: Patient currently experiencing ***.   Patient may benefit from ***.  Plan: Follow up with behavioral health clinician on : *** Behavioral recommendations: *** Referral(s): {IBH Referrals:21014055}  I discussed the assessment and treatment plan with the patient and/or parent/guardian. They were provided an opportunity to ask questions and all were answered. They agreed with the plan and demonstrated an understanding of the instructions.   They were advised to call back or seek an in-person evaluation if the symptoms worsen or if the condition fails to improve as anticipated.  Valetta Close Nasrin Lanzo, LCSW

## 2021-07-30 NOTE — Progress Notes (Signed)
Pt is presently resting quietly. Respirations are even and unlabored. No distress noted. Pt's safety is maintained.

## 2021-07-30 NOTE — ED Notes (Signed)
Pt is currently on the phone. 

## 2021-07-30 NOTE — Progress Notes (Signed)
Pt is awake, alert and oriented. Pt is presently resting. No signs of acute distress noted. Pt did not complained of pain or discomfort. Pt denies SI/HI/AVH at this time. Staff will monitor for pt's safety.

## 2021-07-30 NOTE — ED Notes (Signed)
Patient is resting comfortably. 

## 2021-07-30 NOTE — Progress Notes (Signed)
Pt presently in dinning room eating dinner. No distress or discomfort noted. Pt's safety is maintained.

## 2021-07-30 NOTE — Clinical Social Work Psych Note (Signed)
CSW Update    CSW met with the patient for introduction and to begin discussion regarding discharge planning.   Patient shared that she has struggled with her increasing anxiety and panic attacks for the last month. She shared that her anxiety also triggers her depressive symptoms.   Patient reports she lives alone and is currently employed. Patient expressed interest in partial hospitalization program services.    Patient reports she does not have any additional questions or concerns at this time.     Radonna Ricker, MSW, LCSW Clinical Education officer, museum ([place]) [facility]

## 2021-07-30 NOTE — ED Provider Notes (Signed)
Behavioral Health Progress Note  Date and Time: 07/30/2021 1:22 PM Name: Katelyn Parker MRN:  [id number]  Subjective:    On today's reassessment Katelyn Parker, 25 y.o., female patient seen face to face by this provider, chart reviewed and discussed with treatment team and Dr. Dwyane Dee on 07/30/21.     Katelyn Parker is lying in her bed.  She makes fleeting eye contact.  Her speech is clear, coherent, and decreased.  She is slightly withdrawn upon assessment.  She is cooperative and alert/oriented x 4.  She continues to endorse depression and anxiety.  Reports she has taken hydroxyzine and it does help with her anxiety when she is having a bad moment.  She not appear to be responding to internal/external stimuli.  Reports she did sleep a little better last night after taking trazodone, but states it made her "feel weird".  Reports she would rather take hydroxyzine before bedtime instead.  Reports she slept roughly 5 hours and no concerns with appetite.  Continues to endorse passive suicidal ideation.  States when she thinks about outside factors and the amount of anxiety that she is dealing with she continues to think "if I do not wake up it would not be a big deal".  Denies homicidal ideation, auditory and visual hallucinations.  Reports she met with social worker and feels encouraged with the possible treatment options she could get upon discharge, but states she is not ready to be discharged quite yet. Patient's feelings and progress discussed.  Reassurance, support, and encouragement provided.  Reports no health concerns at this time.  Reports she is tolerating the Lexapro and BuSpar without any adverse effects.  Diagnosis:  Final diagnoses:  Severe episode of recurrent major depressive disorder, without psychotic features ([place])  Generalized anxiety disorder    Total Time spent with patient: 30 minutes  Past Psychiatric History: MDD, GAD Past Medical History: History reviewed. No pertinent past medical  history. History reviewed. No pertinent surgical history. Family History: History reviewed. No pertinent family history. Family Psychiatric  History: Unknown Social History:  Social History   Substance and Sexual Activity  Alcohol Use Yes     Social History   Substance and Sexual Activity  Drug Use Not Currently    Social History   Socioeconomic History   Marital status: Single    Spouse name: Not on file   Number of children: Not on file   Years of education: Not on file   Highest education level: Not on file  Occupational History   Not on file  Tobacco Use   Smoking status: Some Days   Smokeless tobacco: Never  Vaping Use   Vaping Use: Never used  Substance and Sexual Activity   Alcohol use: Yes   Drug use: Not Currently   Sexual activity: Not on file  Other Topics Concern   Not on file  Social History Narrative   Not on file   Social Determinants of Health   Financial Resource Strain: Not on file  Food Insecurity: Not on file  Transportation Needs: Not on file  Physical Activity: Not on file  Stress: Not on file  Social Connections: Not on file   SDOH:  SDOH Screenings   Alcohol Screen: Not on file  Depression (PHQ2-9): Medium Risk   PHQ-2 Score: 15  Financial Resource Strain: Not on file  Food Insecurity: Not on file  Housing: Not on file  Physical Activity: Not on file  Social Connections: Not on file  Stress: Not on file  Tobacco Use: High Risk   Smoking Tobacco Use: Some Days   Smokeless Tobacco Use: Never  Transportation Needs: Not on file   Additional Social History:    Pain Medications: see MAR Prescriptions: see MAR Over the Counter: see MAR History of alcohol / drug use?: Yes Name of Substance 1: etoh 1 - Age of First Use: undisclosed 1 - Amount (size/oz): variable 1 - Frequency: rarely 1 - Last Use / Amount: several days ago 1 - Method of Aquiring: store 1- Route of Use: oral/drink Name of Substance 2: THC 2 - Age of First  Use: undisclosed 2 - Amount (size/oz): variable 2 - Frequency: socially 2 - Last Use / Amount: "its been quite a while" 2 - Method of Aquiring: streets 2 - Route of Substance Use: oral/smoke      Sleep: Fair  Appetite:  Good  Current Medications:  Current Facility-Administered Medications  Medication Dose Route Frequency Provider Last Rate Last Admin   acetaminophen (TYLENOL) tablet 650 mg  650 mg Oral Q6H PRN Ajibola, Ene A, NP   650 mg at 07/30/21 0934   alum & mag hydroxide-simeth (MAALOX/MYLANTA) 200-200-20 MG/5ML suspension 30 mL  30 mL Oral Q4H PRN Ajibola, Ene A, NP       busPIRone (BUSPAR) tablet 10 mg  10 mg Oral BID Revonda Humphrey, NP   10 mg at 07/30/21 0934   escitalopram (LEXAPRO) tablet 10 mg  10 mg Oral Daily Revonda Humphrey, NP   10 mg at 07/30/21 0934   hydrOXYzine (ATARAX/VISTARIL) tablet 25 mg  25 mg Oral TID PRN Ajibola, Ene A, NP   25 mg at 07/30/21 0617   magnesium hydroxide (MILK OF MAGNESIA) suspension 30 mL  30 mL Oral Daily PRN Ajibola, Ene A, NP       traZODone (DESYREL) tablet 50 mg  50 mg Oral QHS PRN Ajibola, Ene A, NP   50 mg at 07/29/21 2315   Current Outpatient Medications  Medication Sig Dispense Refill   diphenhydramine-acetaminophen (TYLENOL PM) 25-500 MG TABS tablet Take 1-2 tablets by mouth at bedtime as needed (sleep/pain).     escitalopram (LEXAPRO) 10 MG tablet Take 1 tablet (10 mg total) by mouth daily. 30 tablet 1   fluconazole (DIFLUCAN) 150 MG tablet Take 1 tablet (150 mg total) by mouth daily. (Patient not taking: No sig reported) 1 tablet 0   metroNIDAZOLE (FLAGYL) 500 MG tablet Take 1 tablet (500 mg total) by mouth 2 (two) times daily. 14 tablet 0   ondansetron (ZOFRAN ODT) 4 MG disintegrating tablet Take 1 tablet (4 mg total) by mouth every 8 (eight) hours as needed for nausea or vomiting. (Patient not taking: No sig reported) 12 tablet 0    Labs  Lab Results:  Admission on 07/28/2021  Component Date Value Ref Range Status    SARS Coronavirus 2 by RT PCR 07/28/2021 NEGATIVE  NEGATIVE Final   Comment: (NOTE) SARS-CoV-2 target nucleic acids are NOT DETECTED.  The SARS-CoV-2 RNA is generally detectable in upper respiratory specimens during the acute phase of infection. The lowest concentration of SARS-CoV-2 viral copies this assay can detect is 138 copies/mL. A negative result does not preclude SARS-Cov-2 infection and should not be used as the sole basis for treatment or other patient management decisions. A negative result may occur with  improper specimen collection/handling, submission of specimen other than nasopharyngeal swab, presence of viral mutation(s) within the areas targeted by this assay, and inadequate number of viral copies(<138 copies/mL). A  negative result must be combined with clinical observations, patient history, and epidemiological information. The expected result is Negative.  Fact Sheet for Patients:  EntrepreneurPulse.com.au  Fact Sheet for Healthcare Providers:  IncredibleEmployment.be  This test is no                          t yet approved or cleared by the [place] FDA and  has been authorized for detection and/or diagnosis of SARS-CoV-2 by FDA under an Emergency Use Authorization (EUA). This EUA will remain  in effect (meaning this test can be used) for the duration of the COVID-19 declaration under Section 564(b)(1) of the Act, 21 U.S.C.section 360bbb-3(b)(1), unless the authorization is terminated  or revoked sooner.       Influenza A by PCR 07/28/2021 NEGATIVE  NEGATIVE Final   Influenza B by PCR 07/28/2021 NEGATIVE  NEGATIVE Final   Comment: (NOTE) The Xpert Xpress SARS-CoV-2/FLU/RSV plus assay is intended as an aid in the diagnosis of influenza from Nasopharyngeal swab specimens and should not be used as a sole basis for treatment. Nasal washings and aspirates are unacceptable for Xpert Xpress  SARS-CoV-2/FLU/RSV testing.  Fact Sheet for Patients: EntrepreneurPulse.com.au  Fact Sheet for Healthcare Providers: IncredibleEmployment.be  This test is not yet approved or cleared by the [place] FDA and has been authorized for detection and/or diagnosis of SARS-CoV-2 by FDA under an Emergency Use Authorization (EUA). This EUA will remain in effect (meaning this test can be used) for the duration of the COVID-19 declaration under Section 564(b)(1) of the Act, 21 U.S.C. section 360bbb-3(b)(1), unless the authorization is terminated or revoked.  Performed at [facility], [place] [address] [place], [place] [address]    TSH 07/28/2021 2.039  0.350 - 4.500 uIU/mL Final   Comment: Performed by a 3rd Generation assay with a functional sensitivity of <=0.01 uIU/mL. Performed at [facility], [place] [address] [place], [place] [address]    POC Amphetamine UR 07/28/2021 None Detected  NONE DETECTED (Cut Off Level 1000 ng/mL) Final   POC Secobarbital (BAR) 07/28/2021 None Detected  NONE DETECTED (Cut Off Level 300 ng/mL) Final   POC Buprenorphine (BUP) 07/28/2021 None Detected  NONE DETECTED (Cut Off Level 10 ng/mL) Final   POC Oxazepam (BZO) 07/28/2021 None Detected  NONE DETECTED (Cut Off Level 300 ng/mL) Final   POC Cocaine UR 07/28/2021 None Detected  NONE DETECTED (Cut Off Level 300 ng/mL) Final   POC Methamphetamine UR 07/28/2021 None Detected  NONE DETECTED (Cut Off Level 1000 ng/mL) Final   POC Morphine 07/28/2021 None Detected  NONE DETECTED (Cut Off Level 300 ng/mL) Final   POC Oxycodone UR 07/28/2021 None Detected  NONE DETECTED (Cut Off Level 100 ng/mL) Final   POC Methadone UR 07/28/2021 None Detected  NONE DETECTED (Cut Off Level 300 ng/mL) Final   POC Marijuana UR 07/28/2021 Positive (A) NONE DETECTED (Cut Off Level 50 ng/mL) Final   Preg Test, Ur 07/28/2021 NEGATIVE  NEGATIVE Final   Comment:        THE SENSITIVITY OF  THIS METHODOLOGY IS >24 mIU/mL    SARS Coronavirus 2 Ag 07/28/2021 Negative  Negative Final   SARSCOV2ONAVIRUS 2 AG 07/28/2021 NEGATIVE  NEGATIVE Final   Comment: (NOTE) SARS-CoV-2 antigen NOT DETECTED.   Negative results are presumptive.  Negative results do not preclude SARS-CoV-2 infection and should not be used as the sole basis for treatment or other patient management decisions, including infection  control decisions, particularly in the presence of clinical signs and  symptoms consistent with COVID-19, or in those who have been in contact with the virus.  Negative results must be combined with clinical observations, patient history, and epidemiological information. The expected result is Negative.  Fact Sheet for Patients: HandmadeRecipes.com.cy  Fact Sheet for Healthcare Providers: FuneralLife.at  This test is not yet approved or cleared by the [place] FDA and  has been authorized for detection and/or diagnosis of SARS-CoV-2 by FDA under an Emergency Use Authorization (EUA).  This EUA will remain in effect (meaning this test can be used) for the duration of  the COV                          ID-19 declaration under Section 564(b)(1) of the Act, 21 U.S.C. section 360bbb-3(b)(1), unless the authorization is terminated or revoked sooner.    Admission on 07/27/2021, Discharged on 07/27/2021  Component Date Value Ref Range Status   Sodium 07/27/2021 137  135 - 145 mmol/L Final   Potassium 07/27/2021 3.7  3.5 - 5.1 mmol/L Final   Chloride 07/27/2021 107  98 - 111 mmol/L Final   CO2 07/27/2021 22  22 - 32 mmol/L Final   Glucose, Bld 07/27/2021 95  70 - 99 mg/dL Final   Glucose reference range applies only to samples taken after fasting for at least 8 hours.   BUN 07/27/2021 8  6 - 20 mg/dL Final   Creatinine, Ser 07/27/2021 0.87  0.44 - 1.00 mg/dL Final   Calcium 07/27/2021 9.0  8.9 - 10.3 mg/dL Final   GFR, Estimated  07/27/2021 >60  >60 mL/min Final   Comment: (NOTE) Calculated using the CKD-EPI Creatinine Equation (2021)    Anion gap 07/27/2021 8  5 - 15 Final   Performed at [facility] [address] [place], [place] [address]   WBC 07/27/2021 5.7  4.0 - 10.5 K/uL Final   RBC 07/27/2021 4.81  3.87 - 5.11 MIL/uL Final   Hemoglobin 07/27/2021 13.2  12.0 - 15.0 g/dL Final   HCT 07/27/2021 41.6  36.0 - 46.0 % Final   MCV 07/27/2021 86.5  80.0 - 100.0 fL Final   MCH 07/27/2021 27.4  26.0 - 34.0 pg Final   MCHC 07/27/2021 31.7  30.0 - 36.0 g/dL Final   RDW 07/27/2021 13.4  11.5 - 15.5 % Final   Platelets 07/27/2021 286  150 - 400 K/uL Final   nRBC 07/27/2021 0.0  0.0 - 0.2 % Final   Performed at [facility] [address], [place], [place] [address]   Troponin I (High Sensitivity) 07/27/2021 4  <18 ng/L Final   Comment: (NOTE) Elevated high sensitivity troponin I (hsTnI) values and significant  changes across serial measurements may suggest ACS but many other  chronic and acute conditions are known to elevate hsTnI results.  Refer to the "Links" section for chest pain algorithms and additional  guidance. Performed at [facility], [place] [address], [place], [place] [address]    I-stat hCG, quantitative 07/27/2021 <5.0  <5 mIU/mL Final   Comment 3 07/27/2021          Final   Comment:   GEST. AGE      CONC.  (mIU/mL)   <=1 WEEK        5 - 50     2 WEEKS       50 - 500  3 WEEKS       100 - 10,000     4 WEEKS     1,000 - 30,000        FEMALE AND NON-PREGNANT FEMALE:     LESS THAN 5 mIU/mL    Troponin I (High Sensitivity) 07/27/2021 3  <18 ng/L Final   Comment: (NOTE) Elevated high sensitivity troponin I (hsTnI) values and significant  changes across serial measurements may suggest ACS but many other  chronic and acute conditions are known to elevate hsTnI results.  Refer to the "Links" section for chest pain algorithms and additional  guidance. Performed at [facility], [place] [address], [place], [place] [address]   Admission on 07/19/2021, Discharged on 07/19/2021  Component Date Value Ref Range Status   Color, Urine 07/19/2021 YELLOW  YELLOW Final   APPearance 07/19/2021 HAZY (A) CLEAR Final   Specific Gravity, Urine 07/19/2021 1.021  1.005 - 1.030 Final   pH 07/19/2021 6.0  5.0 - 8.0 Final   Glucose, UA 07/19/2021 NEGATIVE  NEGATIVE mg/dL Final   Hgb urine dipstick 07/19/2021 NEGATIVE  NEGATIVE Final   Bilirubin Urine 07/19/2021 NEGATIVE  NEGATIVE Final   Ketones, ur 07/19/2021 NEGATIVE  NEGATIVE mg/dL Final   Protein, ur 07/19/2021 NEGATIVE  NEGATIVE mg/dL Final   Nitrite 07/19/2021 NEGATIVE  NEGATIVE Final   Leukocytes,Ua 07/19/2021 SMALL (A) NEGATIVE Final   RBC / HPF 07/19/2021 0-5  0 - 5 RBC/hpf Final   WBC, UA 07/19/2021 11-20  0 - 5 WBC/hpf Final   Bacteria, UA 07/19/2021 RARE (A) NONE SEEN Final   Squamous Epithelial / LPF 07/19/2021 0-5  0 - 5 Final   Mucus 07/19/2021 PRESENT   Final   Performed at [facility], [place] [address] [place], [place] [address]   Preg Test, Ur 07/19/2021 NEGATIVE  NEGATIVE Final   Comment:        THE SENSITIVITY OF THIS METHODOLOGY IS >20 mIU/mL. Performed at [facility], [place] [address] [place], [place] [address]    Yeast Wet Prep HPF POC 07/19/2021 NONE SEEN  NONE SEEN Final   Swab received with less than 0.5 mL of saline, saline added to specimen, interpret results with caution.   Trich, Wet Prep 07/19/2021 PRESENT (A) NONE SEEN Final   Clue Cells Wet Prep HPF POC 07/19/2021 PRESENT (A) NONE SEEN Final   WBC, Wet Prep HPF POC 07/19/2021 MANY (A) NONE SEEN Final   Sperm 07/19/2021 NONE SEEN   Final   Performed at [facility], [place] [address] [place], [place] [address]   Neisseria Gonorrhea 07/19/2021 Negative   Final   Chlamydia 07/19/2021 Positive (A)  Final   Comment 07/19/2021 Normal Reference [place] Chlamydia - Negative   Final   Comment 07/19/2021 Normal Reference Range  Neisseria Gonorrhea - Negative   Final  Admission on 06/05/2021, Discharged on 06/05/2021  Component Date Value Ref Range Status   Lipase 06/05/2021 24  11 - 51 U/L Final   Performed at [facility], [place] [address], [place], [place] [address]   Sodium 06/05/2021 137  135 - 145 mmol/L Final   Potassium 06/05/2021 3.5  3.5 - 5.1 mmol/L Final   Chloride 06/05/2021 105  98 - 111 mmol/L Final   CO2 06/05/2021 22  22 - 32 mmol/L Final   Glucose, Bld 06/05/2021 132 (A) 70 - 99 mg/dL Final   Glucose reference range applies only to samples taken after fasting for  at least 8 hours.   BUN 06/05/2021 11  6 - 20 mg/dL Final   Creatinine, Ser 06/05/2021 0.96  0.44 - 1.00 mg/dL Final   Calcium 06/05/2021 8.8 (A) 8.9 - 10.3 mg/dL Final   Total Protein 06/05/2021 6.4 (A) 6.5 - 8.1 g/dL Final   Albumin 06/05/2021 3.4 (A) 3.5 - 5.0 g/dL Final   AST 06/05/2021 22  15 - 41 U/L Final   ALT 06/05/2021 11  0 - 44 U/L Final   Alkaline Phosphatase 06/05/2021 63  38 - 126 U/L Final   Total Bilirubin 06/05/2021 0.5  0.3 - 1.2 mg/dL Final   GFR, Estimated 06/05/2021 >60  >60 mL/min Final   Comment: (NOTE) Calculated using the CKD-EPI Creatinine Equation (2021)    Anion gap 06/05/2021 10  5 - 15 Final   Performed at [facility] [address] [place], [place] [address]   WBC 06/05/2021 7.1  4.0 - 10.5 K/uL Final   RBC 06/05/2021 4.84  3.87 - 5.11 MIL/uL Final   Hemoglobin 06/05/2021 13.5  12.0 - 15.0 g/dL Final   HCT 06/05/2021 42.3  36.0 - 46.0 % Final   MCV 06/05/2021 87.4  80.0 - 100.0 fL Final   MCH 06/05/2021 27.9  26.0 - 34.0 pg Final   MCHC 06/05/2021 31.9  30.0 - 36.0 g/dL Final   RDW 06/05/2021 14.3  11.5 - 15.5 % Final   Platelets 06/05/2021 340  150 - 400 K/uL Final   nRBC 06/05/2021 0.0  0.0 - 0.2 % Final   Performed at [facility] [address] [place], [place] [address]   Color, Urine 06/05/2021 STRAW (A) YELLOW Final   APPearance 06/05/2021 CLEAR  CLEAR Final    Specific Gravity, Urine 06/05/2021 1.003 (A) 1.005 - 1.030 Final   pH 06/05/2021 6.0  5.0 - 8.0 Final   Glucose, UA 06/05/2021 NEGATIVE  NEGATIVE mg/dL Final   Hgb urine dipstick 06/05/2021 LARGE (A) NEGATIVE Final   Bilirubin Urine 06/05/2021 NEGATIVE  NEGATIVE Final   Ketones, ur 06/05/2021 NEGATIVE  NEGATIVE mg/dL Final   Protein, ur 06/05/2021 NEGATIVE  NEGATIVE mg/dL Final   Nitrite 06/05/2021 NEGATIVE  NEGATIVE Final   Leukocytes,Ua 06/05/2021 MODERATE (A) NEGATIVE Final   RBC / HPF 06/05/2021 0-5  0 - 5 RBC/hpf Final   WBC, UA 06/05/2021 6-10  0 - 5 WBC/hpf Final   Bacteria, UA 06/05/2021 RARE (A) NONE SEEN Final   Squamous Epithelial / LPF 06/05/2021 0-5  0 - 5 Final   Mucus 06/05/2021 PRESENT   Final   Performed at [facility], [place] [address], [place], [place] [address]   I-stat hCG, quantitative 06/05/2021 <5.0  <5 mIU/mL Final   Comment 3 06/05/2021          Final   Comment:   GEST. AGE      CONC.  (mIU/mL)   <=1 WEEK        5 - 50     2 WEEKS       50 - 500     3 WEEKS       100 - 10,000     4 WEEKS     1,000 - 30,000        FEMALE AND NON-PREGNANT FEMALE:     LESS THAN 5 mIU/mL   Admission on 03/03/2021, Discharged on 03/03/2021  Component Date Value Ref Range Status   Group A Strep by PCR 03/03/2021 NOT DETECTED  NOT  DETECTED Final   Performed at [facility], [place] [address] [place], [place] [address]    Blood Alcohol level:  No results found for: [facility]  Metabolic Disorder Labs: No results found for: HGBA1C, MPG No results found for: PROLACTIN No results found for: CHOL, TRIG, HDL, CHOLHDL, VLDL, LDLCALC  Therapeutic Lab Levels: No results found for: LITHIUM No results found for: VALPROATE No components found for:  CBMZ  Physical Findings   GAD-7    Flowsheet Row Office Visit from 07/23/2021 in [place] [facility] [place] [facility]  Total GAD-7 Score 21      PHQ2-9    [place] Office Visit from 07/23/2021 in  [place] [facility] [place] [facility]  PHQ-2 Total Score 4  PHQ-9 Total Score 15      Flowsheet Row ED from 07/28/2021 in [facility] ED from 07/27/2021 in [facility] ED from 07/19/2021 in [facility] No Risk No Risk No Risk        Musculoskeletal  Strength & Muscle Tone: within normal limits Gait & Station: normal Patient leans: N/A  Psychiatric Specialty Exam  Presentation  General Appearance: Appropriate for Environment; Casual  Eye Contact:Fleeting  Speech:Clear and Coherent; Normal Rate  Speech Volume:Normal  Handedness:Right   Mood and Affect  Mood:Anxious; Depressed  Affect:Congruent   Thought Process  Thought Processes:Coherent  Descriptions of Associations:Intact  Orientation:Full (Time, Place and Person)  Thought Content:Logical  Diagnosis of Schizophrenia or Schizoaffective disorder in past: No    Hallucinations:Hallucinations: None  Ideas of Reference:None  Suicidal Thoughts:Suicidal Thoughts: Yes, Passive SI Passive Intent and/or Plan: Without Intent; Without Plan; Without Means to Carry Out  Homicidal Thoughts:Homicidal Thoughts: No   Sensorium  Memory:Immediate Good; Recent Good; Remote Good  Judgment:Good  Insight:Good   Executive Functions  Concentration:Good  Attention Span:Good  [place] of Knowledge:Good  Language:Good   Psychomotor Activity  Psychomotor Activity:Psychomotor Activity: Normal   Assets  Assets:Communication Skills; Desire for Improvement; Financial Resources/Insurance; Housing; Physical Health; Resilience; Social Support; Vocational/Educational   Sleep  Sleep:Sleep: Fair Number of Hours of Sleep: 5   No data recorded  Physical Exam  Physical Exam Vitals and nursing note reviewed.  Constitutional:      General: She is not in acute  distress.    Appearance: Normal appearance. She is not ill-appearing.  HENT:     Head: Normocephalic.  Eyes:     General:        Right eye: No discharge.        Left eye: No discharge.     Conjunctiva/sclera: Conjunctivae normal.  Cardiovascular:     Rate and Rhythm: Normal rate.  Pulmonary:     Effort: Pulmonary effort is normal.  Musculoskeletal:        General: Normal range of motion.     Cervical back: Normal range of motion.  Skin:    Coloration: Skin is not jaundiced or pale.  Neurological:     Mental Status: She is alert and oriented to person, place, and time.  Psychiatric:        Attention and Perception: Attention and perception normal.        Mood and Affect: Mood is anxious and depressed.        Speech: Speech normal.        Behavior: Behavior is withdrawn.  Thought Content: Thought content includes suicidal ideation. Thought content does not include suicidal plan.        Cognition and Memory: Cognition normal.        Judgment: Judgment normal.   Review of Systems  Constitutional: Negative.  Negative for chills and fever.  HENT: Negative.  Negative for hearing loss.   Eyes: Negative.   Respiratory: Negative.  Negative for cough.   Cardiovascular: Negative.  Negative for chest pain.  Gastrointestinal: Negative.   Musculoskeletal: Negative.   Skin: Negative.   Neurological: Negative.   Psychiatric/Behavioral:  Positive for depression and suicidal ideas. The patient is nervous/anxious.   Blood pressure 112/66, pulse 65, temperature 98.6 F (37 C), temperature source Oral, resp. rate 16, SpO2 100 %. There is no height or weight on file to calculate BMI.  Treatment Plan Summary: Daily contact with patient to assess and evaluate symptoms and progress in treatment and Medication management.  Patient continues to meet criteria for treatment in the [facility].  No medication changes at this time, will continue to monitor patient progress.  Revonda Humphrey,  NP 07/30/2021 1:22 PM

## 2021-07-30 NOTE — ED Notes (Signed)
Patient c/o headache but doesn't want any more medication - I did instruct her to talk to the provider in the am as some medications may cause headache. C/o also some intermittent dizziness. Orthostatic BP negative.  Advised to bring that up with provider as well and to sit down if she felt dizzy upon ambulation

## 2021-07-30 NOTE — ED Notes (Signed)
Patient is resting quietly in bed with eyes closed. No signs or symptoms of distress or labored breathing. Will continue to monitor for safety.

## 2021-07-30 NOTE — ED Notes (Signed)
Patient was able to sleep through the night. She stated that "it was hard to get to sleep at first". She asked for Vistaril at about 0615 this morning. She was given 25 mgs Vistaril at 0617. She has returned to her room and is sleeping again. No signs or symptoms of distress. Respirations are unlabored/even. Environment is secure. Will continue to monitor for safety.

## 2021-07-31 MED ORDER — MELATONIN 3 MG PO TABS
3.0000 mg | ORAL_TABLET | Freq: Every evening | ORAL | Status: DC | PRN
  Administered 2021-07-31: 3 mg via ORAL
  Filled 2021-07-31: qty 1

## 2021-07-31 NOTE — Group Note (Signed)
Group Topic: Change and Accountability  Group Date: 07/31/2021 Start Time: 1015 End Time: 1140 Facilitators: Juluis Pitch P  Department: [facility]  Number of Participants: 3  Group Focus: problem solving Treatment Modality:  Solution-Focused Therapy Interventions utilized were problem solving Purpose: increase insight  Name: Katelyn Parker Date of Birth: [birth year]  MR: [id number]    Level of Participation: active Quality of Participation: attentive, cooperative, engaged, motivated, offered feedback, and supportive Interactions with others: gave feedback Mood/Affect: appropriate, bright, and positive Triggers (if applicable): n/a Cognition: goal directed, insightful, and logical Progress: Gaining insight Response: Pt engaged in group. Offered feedback and solutions for others. Received and accepted solutions from peers and staff. Plan: patient will be encouraged to have more positive thoughts of self rather than tear self down.  Patients Problems:  Patient Active Problem List   Diagnosis Date Noted   MDD (major depressive disorder), severe (HCC) 07/28/2021   Generalized anxiety disorder    Current occasional smoker 07/23/2021   BMI 40.0-44.9, adult (HCC) 07/23/2021   Anxiety 07/23/2021   ECZEMA, ATOPIC DERMATITIS 01/29/2007

## 2021-07-31 NOTE — ED Notes (Signed)
Pt sleeping in no acute distress. RR even and unlabored. Safety maintained. 

## 2021-07-31 NOTE — ED Notes (Signed)
Pt in group in no acute distress. Safety maintained.

## 2021-07-31 NOTE — ED Notes (Signed)
Pt in group. No acute distress noted. Safety maintained.

## 2021-07-31 NOTE — ED Provider Notes (Addendum)
Behavioral Health Progress Note  Date and Time: 07/31/2021 9:40 AM Name: Katelyn Parker MRN:  [id number]  Subjective:    Katelyn Parker, 25 y.o., female patient seen face to face by this provider, chart reviewed and discussed with treatment team and Dr. Serafina Mitchell on 07/31/21.  On today's reevaluation.  Patient is sitting on her bed.  She makes fair eye contact and is fairly groomed.  Her speech is clear, coherent normal rate and soft tone.  She is alert and oriented x4.  She is cooperative.  Reports she slept roughly 6 hours last night.  Continues to endorse depression and anxiety, but states it has improved.  Reports, "I think the medications might be working".  Patient states she still endorses suicidal thoughts of "if I do not wake up it would not matter". States those thoughts have improved since she has been on the unit.  Patient contracts for safety.  States, I would not hurt myself I do not want to die".  Patient states her time on the unit has been helpful and she is looking forward to attending PHP upon discharge.  Denies auditory and visual hallucinations.  Reports no health concerns at this time, and is tolerating medications without any adverse effects.  Patient has continued to participate in group sessions.  Diagnosis:  Final diagnoses:  Severe episode of recurrent major depressive disorder, without psychotic features ([place])  Generalized anxiety disorder    Total Time spent with patient: 30 minutes  Past Psychiatric History: MDD, GAD Past Medical History: History reviewed. No pertinent past medical history. History reviewed. No pertinent surgical history. Family History: History reviewed. No pertinent family history. Family Psychiatric  History: Unknown Social History:  Social History   Substance and Sexual Activity  Alcohol Use Yes     Social History   Substance and Sexual Activity  Drug Use Not Currently    Social History   Socioeconomic History   Marital status:  Single    Spouse name: Not on file   Number of children: Not on file   Years of education: Not on file   Highest education level: Not on file  Occupational History   Not on file  Tobacco Use   Smoking status: Some Days   Smokeless tobacco: Never  Vaping Use   Vaping Use: Never used  Substance and Sexual Activity   Alcohol use: Yes   Drug use: Not Currently   Sexual activity: Not on file  Other Topics Concern   Not on file  Social History Narrative   Not on file   Social Determinants of Health   Financial Resource Strain: Not on file  Food Insecurity: Not on file  Transportation Needs: Not on file  Physical Activity: Not on file  Stress: Not on file  Social Connections: Not on file   SDOH:  SDOH Screenings   Alcohol Screen: Not on file  Depression (PHQ2-9): Medium Risk   PHQ-2 Score: 15  Financial Resource Strain: Not on file  Food Insecurity: Not on file  Housing: Not on file  Physical Activity: Not on file  Social Connections: Not on file  Stress: Not on file  Tobacco Use: High Risk   Smoking Tobacco Use: Some Days   Smokeless Tobacco Use: Never  Transportation Needs: Not on file   Additional Social History:    Pain Medications: see MAR Prescriptions: see MAR Over the Counter: see MAR History of alcohol / drug use?: Yes Name of Substance 1: etoh 1 - Age of First  Use: undisclosed 1 - Amount (size/oz): variable 1 - Frequency: rarely 1 - Last Use / Amount: several days ago 1 - Method of Aquiring: store 1- Route of Use: oral/drink Name of Substance 2: THC 2 - Age of First Use: undisclosed 2 - Amount (size/oz): variable 2 - Frequency: socially 2 - Last Use / Amount: "its been quite a while" 2 - Method of Aquiring: streets 2 - Route of Substance Use: oral/smoke    Sleep: Fair  Appetite:  Good  Current Medications:  Current Facility-Administered Medications  Medication Dose Route Frequency Provider Last Rate Last Admin   acetaminophen (TYLENOL)  tablet 650 mg  650 mg Oral Q6H PRN Ajibola, Ene A, NP   650 mg at 07/30/21 2026   alum & mag hydroxide-simeth (MAALOX/MYLANTA) 200-200-20 MG/5ML suspension 30 mL  30 mL Oral Q4H PRN Ajibola, Ene A, NP       busPIRone (BUSPAR) tablet 10 mg  10 mg Oral BID Revonda Humphrey, NP   10 mg at 07/30/21 2210   escitalopram (LEXAPRO) tablet 10 mg  10 mg Oral Daily Revonda Humphrey, NP   10 mg at 07/30/21 0934   hydrOXYzine (ATARAX/VISTARIL) tablet 25 mg  25 mg Oral TID PRN Ajibola, Ene A, NP   25 mg at 07/30/21 2341   magnesium hydroxide (MILK OF MAGNESIA) suspension 30 mL  30 mL Oral Daily PRN Ajibola, Ene A, NP       traZODone (DESYREL) tablet 50 mg  50 mg Oral QHS PRN Ajibola, Ene A, NP   50 mg at 07/30/21 2341   Current Outpatient Medications  Medication Sig Dispense Refill   diphenhydramine-acetaminophen (TYLENOL PM) 25-500 MG TABS tablet Take 1-2 tablets by mouth at bedtime as needed (sleep/pain).     escitalopram (LEXAPRO) 10 MG tablet Take 1 tablet (10 mg total) by mouth daily. 30 tablet 1   fluconazole (DIFLUCAN) 150 MG tablet Take 1 tablet (150 mg total) by mouth daily. (Patient not taking: No sig reported) 1 tablet 0   metroNIDAZOLE (FLAGYL) 500 MG tablet Take 1 tablet (500 mg total) by mouth 2 (two) times daily. 14 tablet 0   ondansetron (ZOFRAN ODT) 4 MG disintegrating tablet Take 1 tablet (4 mg total) by mouth every 8 (eight) hours as needed for nausea or vomiting. (Patient not taking: No sig reported) 12 tablet 0    Labs  Lab Results:  Admission on 07/28/2021  Component Date Value Ref Range Status   SARS Coronavirus 2 by RT PCR 07/28/2021 NEGATIVE  NEGATIVE Final   Comment: (NOTE) SARS-CoV-2 target nucleic acids are NOT DETECTED.  The SARS-CoV-2 RNA is generally detectable in upper respiratory specimens during the acute phase of infection. The lowest concentration of SARS-CoV-2 viral copies this assay can detect is 138 copies/mL. A negative result does not preclude  SARS-Cov-2 infection and should not be used as the sole basis for treatment or other patient management decisions. A negative result may occur with  improper specimen collection/handling, submission of specimen other than nasopharyngeal swab, presence of viral mutation(s) within the areas targeted by this assay, and inadequate number of viral copies(<138 copies/mL). A negative result must be combined with clinical observations, patient history, and epidemiological information. The expected result is Negative.  Fact Sheet for Patients:  EntrepreneurPulse.com.au  Fact Sheet for Healthcare Providers:  IncredibleEmployment.be  This test is no  t yet approved or cleared by the [place] and  has been authorized for detection and/or diagnosis of SARS-CoV-2 by FDA under an Emergency Use Authorization (EUA). This EUA will remain  in effect (meaning this test can be used) for the duration of the COVID-19 declaration under Section 564(b)(1) of the Act, 21 U.S.C.section 360bbb-3(b)(1), unless the authorization is terminated  or revoked sooner.       Influenza A by PCR 07/28/2021 NEGATIVE  NEGATIVE Final   Influenza B by PCR 07/28/2021 NEGATIVE  NEGATIVE Final   Comment: (NOTE) The Xpert Xpress SARS-CoV-2/FLU/RSV plus assay is intended as an aid in the diagnosis of influenza from Nasopharyngeal swab specimens and should not be used as a sole basis for treatment. Nasal washings and aspirates are unacceptable for Xpert Xpress SARS-CoV-2/FLU/RSV testing.  Fact Sheet for Patients: EntrepreneurPulse.com.au  Fact Sheet for Healthcare Providers: IncredibleEmployment.be  This test is not yet approved or cleared by the [place] FDA and has been authorized for detection and/or diagnosis of SARS-CoV-2 by FDA under an Emergency Use Authorization (EUA). This EUA will remain in effect  (meaning this test can be used) for the duration of the COVID-19 declaration under Section 564(b)(1) of the Act, 21 U.S.C. section 360bbb-3(b)(1), unless the authorization is terminated or revoked.  Performed at [facility], [place] [address] [place], [place] [address]    TSH 07/28/2021 2.039  0.350 - 4.500 uIU/mL Final   Comment: Performed by a 3rd Generation assay with a functional sensitivity of <=0.01 uIU/mL. Performed at [facility], [place] [address] [place], [place] [address]    POC Amphetamine UR 07/28/2021 None Detected  NONE DETECTED (Cut Off Level 1000 ng/mL) Final   POC Secobarbital (BAR) 07/28/2021 None Detected  NONE DETECTED (Cut Off Level 300 ng/mL) Final   POC Buprenorphine (BUP) 07/28/2021 None Detected  NONE DETECTED (Cut Off Level 10 ng/mL) Final   POC Oxazepam (BZO) 07/28/2021 None Detected  NONE DETECTED (Cut Off Level 300 ng/mL) Final   POC Cocaine UR 07/28/2021 None Detected  NONE DETECTED (Cut Off Level 300 ng/mL) Final   POC Methamphetamine UR 07/28/2021 None Detected  NONE DETECTED (Cut Off Level 1000 ng/mL) Final   POC Morphine 07/28/2021 None Detected  NONE DETECTED (Cut Off Level 300 ng/mL) Final   POC Oxycodone UR 07/28/2021 None Detected  NONE DETECTED (Cut Off Level 100 ng/mL) Final   POC Methadone UR 07/28/2021 None Detected  NONE DETECTED (Cut Off Level 300 ng/mL) Final   POC Marijuana UR 07/28/2021 Positive (A) NONE DETECTED (Cut Off Level 50 ng/mL) Final   Preg Test, Ur 07/28/2021 NEGATIVE  NEGATIVE Final   Comment:        THE SENSITIVITY OF THIS METHODOLOGY IS >24 mIU/mL    SARS Coronavirus 2 Ag 07/28/2021 Negative  Negative Final   SARSCOV2ONAVIRUS 2 AG 07/28/2021 NEGATIVE  NEGATIVE Final   Comment: (NOTE) SARS-CoV-2 antigen NOT DETECTED.   Negative results are presumptive.  Negative results do not preclude SARS-CoV-2 infection and should not be used as the sole basis for treatment or other patient management decisions, including  infection  control decisions, particularly in the presence of clinical signs and  symptoms consistent with COVID-19, or in those who have been in contact with the virus.  Negative results must be combined with clinical observations, patient history, and epidemiological information. The expected result is Negative.  Fact Sheet for Patients: HandmadeRecipes.com.cy  Fact Sheet for Healthcare Providers: FuneralLife.at  This test is not  yet approved or cleared by the [place] and  has been authorized for detection and/or diagnosis of SARS-CoV-2 by FDA under an Emergency Use Authorization (EUA).  This EUA will remain in effect (meaning this test can be used) for the duration of  the COV                          ID-19 declaration under Section 564(b)(1) of the Act, 21 U.S.C. section 360bbb-3(b)(1), unless the authorization is terminated or revoked sooner.    Admission on 07/27/2021, Discharged on 07/27/2021  Component Date Value Ref Range Status   Sodium 07/27/2021 137  135 - 145 mmol/L Final   Potassium 07/27/2021 3.7  3.5 - 5.1 mmol/L Final   Chloride 07/27/2021 107  98 - 111 mmol/L Final   CO2 07/27/2021 22  22 - 32 mmol/L Final   Glucose, Bld 07/27/2021 95  70 - 99 mg/dL Final   Glucose reference range applies only to samples taken after fasting for at least 8 hours.   BUN 07/27/2021 8  6 - 20 mg/dL Final   Creatinine, Ser 07/27/2021 0.87  0.44 - 1.00 mg/dL Final   Calcium 07/27/2021 9.0  8.9 - 10.3 mg/dL Final   GFR, Estimated 07/27/2021 >60  >60 mL/min Final   Comment: (NOTE) Calculated using the CKD-EPI Creatinine Equation (2021)    Anion gap 07/27/2021 8  5 - 15 Final   Performed at [facility] [address], [place], [place] [address]   WBC 07/27/2021 5.7  4.0 - 10.5 K/uL Final   RBC 07/27/2021 4.81  3.87 - 5.11 MIL/uL Final   Hemoglobin 07/27/2021 13.2  12.0 - 15.0 g/dL Final   HCT 07/27/2021 41.6  36.0 -  46.0 % Final   MCV 07/27/2021 86.5  80.0 - 100.0 fL Final   MCH 07/27/2021 27.4  26.0 - 34.0 pg Final   MCHC 07/27/2021 31.7  30.0 - 36.0 g/dL Final   RDW 07/27/2021 13.4  11.5 - 15.5 % Final   Platelets 07/27/2021 286  150 - 400 K/uL Final   nRBC 07/27/2021 0.0  0.0 - 0.2 % Final   Performed at [facility] [address] [place], [place] [address]   Troponin I (High Sensitivity) 07/27/2021 4  <18 ng/L Final   Comment: (NOTE) Elevated high sensitivity troponin I (hsTnI) values and significant  changes across serial measurements may suggest ACS but many other  chronic and acute conditions are known to elevate hsTnI results.  Refer to the "Links" section for chest pain algorithms and additional  guidance. Performed at [facility], [place] [address] [place], [place] [address]    I-stat hCG, quantitative 07/27/2021 <5.0  <5 mIU/mL Final   Comment 3 07/27/2021          Final   Comment:   GEST. AGE      CONC.  (mIU/mL)   <=1 WEEK        5 - 50     2 WEEKS       50 - 500     3 WEEKS       100 - 10,000     4 WEEKS     1,000 - 30,000        FEMALE AND NON-PREGNANT FEMALE:     LESS THAN 5 mIU/mL    Troponin I (High Sensitivity) 07/27/2021 3  <18 ng/L Final   Comment: (NOTE) Elevated  high sensitivity troponin I (hsTnI) values and significant  changes across serial measurements may suggest ACS but many other  chronic and acute conditions are known to elevate hsTnI results.  Refer to the "Links" section for chest pain algorithms and additional  guidance. Performed at [facility], [place] [address] [place], [place] [address]   Admission on 07/19/2021, Discharged on 07/19/2021  Component Date Value Ref Range Status   Color, Urine 07/19/2021 YELLOW  YELLOW Final   APPearance 07/19/2021 HAZY (A) CLEAR Final   Specific Gravity, Urine 07/19/2021 1.021  1.005 - 1.030 Final   pH 07/19/2021 6.0  5.0 - 8.0 Final   Glucose, UA 07/19/2021 NEGATIVE  NEGATIVE mg/dL Final   Hgb  urine dipstick 07/19/2021 NEGATIVE  NEGATIVE Final   Bilirubin Urine 07/19/2021 NEGATIVE  NEGATIVE Final   Ketones, ur 07/19/2021 NEGATIVE  NEGATIVE mg/dL Final   Protein, ur 07/19/2021 NEGATIVE  NEGATIVE mg/dL Final   Nitrite 07/19/2021 NEGATIVE  NEGATIVE Final   Leukocytes,Ua 07/19/2021 SMALL (A) NEGATIVE Final   RBC / HPF 07/19/2021 0-5  0 - 5 RBC/hpf Final   WBC, UA 07/19/2021 11-20  0 - 5 WBC/hpf Final   Bacteria, UA 07/19/2021 RARE (A) NONE SEEN Final   Squamous Epithelial / LPF 07/19/2021 0-5  0 - 5 Final   Mucus 07/19/2021 PRESENT   Final   Performed at [facility], [place] [address], [place], [place] [address]   Preg Test, Ur 07/19/2021 NEGATIVE  NEGATIVE Final   Comment:        THE SENSITIVITY OF THIS METHODOLOGY IS >20 mIU/mL. Performed at [facility], [place] [address], [place], [place] [address]    Yeast Wet Prep HPF POC 07/19/2021 NONE SEEN  NONE SEEN Final   Swab received with less than 0.5 mL of saline, saline added to specimen, interpret results with caution.   Trich, Wet Prep 07/19/2021 PRESENT (A) NONE SEEN Final   Clue Cells Wet Prep HPF POC 07/19/2021 PRESENT (A) NONE SEEN Final   WBC, Wet Prep HPF POC 07/19/2021 MANY (A) NONE SEEN Final   Sperm 07/19/2021 NONE SEEN   Final   Performed at [facility], [place] [address] [place], [place] [address]   Neisseria Gonorrhea 07/19/2021 Negative   Final   Chlamydia 07/19/2021 Positive (A)  Final   Comment 07/19/2021 Normal Reference [place] Chlamydia - Negative   Final   Comment 07/19/2021 Normal Reference Range Neisseria Gonorrhea - Negative   Final  Admission on 06/05/2021, Discharged on 06/05/2021  Component Date Value Ref Range Status   Lipase 06/05/2021 24  11 - 51 U/L Final   Performed at [facility], [place] [address] [place], [place] [address]   Sodium 06/05/2021 137  135 - 145 mmol/L Final   Potassium 06/05/2021 3.5  3.5 - 5.1 mmol/L Final   Chloride 06/05/2021 105  98 - 111 mmol/L Final   CO2  06/05/2021 22  22 - 32 mmol/L Final   Glucose, Bld 06/05/2021 132 (A) 70 - 99 mg/dL Final   Glucose reference range applies only to samples taken after fasting for at least 8 hours.   BUN 06/05/2021 11  6 - 20 mg/dL Final   Creatinine, Ser 06/05/2021 0.96  0.44 - 1.00 mg/dL Final   Calcium 06/05/2021 8.8 (A) 8.9 - 10.3 mg/dL Final   Total Protein 06/05/2021 6.4 (A) 6.5 - 8.1 g/dL Final   Albumin 06/05/2021 3.4 (A) 3.5 - 5.0 g/dL Final  AST 06/05/2021 22  15 - 41 U/L Final   ALT 06/05/2021 11  0 - 44 U/L Final   Alkaline Phosphatase 06/05/2021 63  38 - 126 U/L Final   Total Bilirubin 06/05/2021 0.5  0.3 - 1.2 mg/dL Final   GFR, Estimated 06/05/2021 >60  >60 mL/min Final   Comment: (NOTE) Calculated using the CKD-EPI Creatinine Equation (2021)    Anion gap 06/05/2021 10  5 - 15 Final   Performed at [facility] [address] [place], [place] [address]   WBC 06/05/2021 7.1  4.0 - 10.5 K/uL Final   RBC 06/05/2021 4.84  3.87 - 5.11 MIL/uL Final   Hemoglobin 06/05/2021 13.5  12.0 - 15.0 g/dL Final   HCT 06/05/2021 42.3  36.0 - 46.0 % Final   MCV 06/05/2021 87.4  80.0 - 100.0 fL Final   MCH 06/05/2021 27.9  26.0 - 34.0 pg Final   MCHC 06/05/2021 31.9  30.0 - 36.0 g/dL Final   RDW 06/05/2021 14.3  11.5 - 15.5 % Final   Platelets 06/05/2021 340  150 - 400 K/uL Final   nRBC 06/05/2021 0.0  0.0 - 0.2 % Final   Performed at [facility] [address] [place], [place] [address]   Color, Urine 06/05/2021 STRAW (A) YELLOW Final   APPearance 06/05/2021 CLEAR  CLEAR Final   Specific Gravity, Urine 06/05/2021 1.003 (A) 1.005 - 1.030 Final   pH 06/05/2021 6.0  5.0 - 8.0 Final   Glucose, UA 06/05/2021 NEGATIVE  NEGATIVE mg/dL Final   Hgb urine dipstick 06/05/2021 LARGE (A) NEGATIVE Final   Bilirubin Urine 06/05/2021 NEGATIVE  NEGATIVE Final   Ketones, ur 06/05/2021 NEGATIVE  NEGATIVE mg/dL Final   Protein, ur 06/05/2021 NEGATIVE  NEGATIVE mg/dL Final   Nitrite 06/05/2021 NEGATIVE   NEGATIVE Final   Leukocytes,Ua 06/05/2021 MODERATE (A) NEGATIVE Final   RBC / HPF 06/05/2021 0-5  0 - 5 RBC/hpf Final   WBC, UA 06/05/2021 6-10  0 - 5 WBC/hpf Final   Bacteria, UA 06/05/2021 RARE (A) NONE SEEN Final   Squamous Epithelial / LPF 06/05/2021 0-5  0 - 5 Final   Mucus 06/05/2021 PRESENT   Final   Performed at [facility], [place] [address] [place], [place] [address]   I-stat hCG, quantitative 06/05/2021 <5.0  <5 mIU/mL Final   Comment 3 06/05/2021          Final   Comment:   GEST. AGE      CONC.  (mIU/mL)   <=1 WEEK        5 - 50     2 WEEKS       50 - 500     3 WEEKS       100 - 10,000     4 WEEKS     1,000 - 30,000        FEMALE AND NON-PREGNANT FEMALE:     LESS THAN 5 mIU/mL   Admission on 03/03/2021, Discharged on 03/03/2021  Component Date Value Ref Range Status   Group A Strep by PCR 03/03/2021 NOT DETECTED  NOT DETECTED Final   Performed at [facility], [place] [address], [place], [place] [address]    Blood Alcohol level:  No results found for: [facility]  Metabolic Disorder Labs: No results found for: HGBA1C, MPG No results found for: PROLACTIN No results found for: CHOL, TRIG, HDL, CHOLHDL, VLDL, LDLCALC  Therapeutic Lab Levels: No results found for: LITHIUM  No results found for: VALPROATE No components found for:  CBMZ  Physical Findings   GAD-7    Flowsheet Row Office Visit from 07/23/2021 in [facility] [place] at [facility]  Total GAD-7 Score 21      PHQ2-9    [place] Office Visit from 07/23/2021 in [place] [facility] [place] at [facility]  PHQ-2 Total Score 4  PHQ-9 Total Score 15      Flowsheet Row ED from 07/28/2021 in [facility] ED from 07/27/2021 in [facility] from 07/19/2021 in [facility] No Risk No Risk No Risk        Musculoskeletal   Strength & Muscle Tone: within normal limits Gait & Station: normal Patient leans: N/A  Psychiatric Specialty Exam  Presentation  General Appearance: Appropriate for Environment; Casual  Eye Contact:Good  Speech:Clear and Coherent  Speech Volume:Normal  Handedness:Right   Mood and Affect  Mood:Anxious; Depressed  Affect:Congruent   Thought Process  Thought Processes:Coherent  Descriptions of Associations:Intact  Orientation:Full (Time, Place and Person)  Thought Content:Logical  Diagnosis of Schizophrenia or Schizoaffective disorder in past: No    Hallucinations:Hallucinations: None  Ideas of Reference:None  Suicidal Thoughts:Suicidal Thoughts: Yes, Passive SI Passive Intent and/or Plan: Without Intent; Without Means to Carry Out; Without Plan  Homicidal Thoughts:Homicidal Thoughts: No   Sensorium  Memory:Immediate Good; Recent Good; Remote Good  Judgment:Good  Insight:Good   Executive Functions  Concentration:Good  Attention Span:Good  Vader of Knowledge:Good  Language:Good   Psychomotor Activity  Psychomotor Activity:Psychomotor Activity: Normal   Assets  Assets:Communication Skills; Desire for Improvement; Financial Resources/Insurance; Housing; Physical Health; Resilience; Social Support; Vocational/Educational   Sleep  Sleep:Sleep: Fair Number of Hours of Sleep: 6   No data recorded  Physical Exam  Physical Exam Vitals and nursing note reviewed.  Constitutional:      General: She is not in acute distress.    Appearance: Normal appearance. She is not ill-appearing.  HENT:     Head: Normocephalic.  Eyes:     General:        Right eye: No discharge.        Left eye: No discharge.     Conjunctiva/sclera: Conjunctivae normal.  Cardiovascular:     Rate and Rhythm: Normal rate.  Pulmonary:     Effort: Pulmonary effort is normal.  Musculoskeletal:        General: Normal range of motion.     Cervical back:  Normal range of motion.  Skin:    Coloration: Skin is not jaundiced or pale.  Neurological:     Mental Status: She is alert and oriented to person, place, and time.  Psychiatric:        Attention and Perception: Attention and perception normal.        Mood and Affect: Mood is anxious and depressed.        Speech: Speech normal.        Behavior: Behavior normal. Behavior is cooperative.        Thought Content: Thought content includes suicidal ideation. Thought content does not include suicidal plan.        Cognition and Memory: Cognition normal.        Judgment: Judgment normal.   Review of Systems  Constitutional: Negative.  Negative for fever.  HENT: Negative.  Negative for hearing loss.   Eyes: Negative.  Respiratory: Negative.  Negative for cough.   Cardiovascular: Negative.  Negative for chest pain.  Musculoskeletal: Negative.   Skin: Negative.   Neurological:  Positive for headaches.  Psychiatric/Behavioral:  Positive for depression and suicidal ideas. The patient is nervous/anxious.   Blood pressure 107/71, pulse 76, temperature 98.6 F (37 C), temperature source Oral, resp. rate 16, SpO2 100 %. There is no height or weight on file to calculate BMI.  Treatment Plan Summary: Daily contact with patient to assess and evaluate symptoms and progress in treatment and Medication management  Patient continues to meet criteria for treatment at [facility].  Patient has met with Education officer, museum and has plans upon discharge to attend PHP.  Plan is to discharge 08/01/2021.  No medication changes at this time.  Revonda Humphrey, NP 07/31/2021 9:40 AM

## 2021-07-31 NOTE — ED Notes (Signed)
Pt playing cards with staff and peers. No acute distress noted. Safety maintained. 

## 2021-07-31 NOTE — ED Notes (Signed)
Pt actively participating in group. No acute distress noted. Safety maintained. ?

## 2021-07-31 NOTE — Clinical Social Work Psych Note (Signed)
CSW Update    CSW met with patient to determine if there were any additional questions or concerns regarding her stay and discharge plans.   [name] reports that she does not have any questions at this time. CSW informed patient of her new outpatient appointment with [facility], which she will start on 08/09/2021 at 2:30pm with Lorin Glass. [name] expressed gratitude for the referral.   [name] did request for CSW to contact her therapist at the [facility] that is scheduled for tomorrow at 10:15am. CSW ensured [name] that an attempt to reschedule the appointment would occur and if there were any concerns, that CSW would notify her. [name] verbalized understanding.   [name] shared that she and the provider dicussed that she is scheduled for discharge tomorrow. She reports that she plans to return home with outpatient psychiatric services and her prescriptions for medications.   [name] reports she does not have any additional questions or concerns at this time.    Radonna Ricker, MSW, LCSW Clinical Education officer, museum ([place]) [facility]

## 2021-07-31 NOTE — Clinical Social Work Psych Note (Signed)
Emotional Reg & Skills  Emotional Regulation & Emotional Regulation Skills  Date: 07/31/21  Type of Therapy/Therapeutic Modalities: Processing Group, Motivational Interviewing, Socratic Questioning, Psycho-Education, CBT  Participation Level: Active  Objective: The purpose of this group is to discuss with patients the importance of emotional regulations skills in every day life and how those skills assist them in maintaining or achieving overall well-being in all dimensions of life.  Therapeutic Goals:  Patient will identify emotions they struggle managing and will reflect on the triggering factors and the behavioral responses associated with them.  Patient will review emotional regulation techniques that will enhance their ability to manage their emotions more appropriately.  Patient will discuss with group members their findings and how they plan to implement emotional regulation techniques in their road to recovery.   Summary of Patient's Progress:  Katelyn Parker was engaged and participated throughout the group session. Katelyn Parker shared that she has struggled managing her emotions in regards to sadness and anxiety. She shared that she understands the importance of responding versus reacting, and plans to utilize the emotional regulation skills discussed to enhance her skill set.

## 2021-07-31 NOTE — ED Notes (Signed)
Pt is sleeping, respirations are even/unlabored, environment check complete/secure, will continue to monitor patient for safety. 

## 2021-07-31 NOTE — Clinical Social Work Psych Note (Signed)
CSW Update  Patient's therapy appointment at the [facility] has been rescheduled to Thursday, 08/02/21 at 9:15am.   Appointment has also been charted in patient's AVS/Discharge Summary.    Baldo Daub, MSW, LCSW Clinical Child psychotherapist (Facility Based Crisis) [facility]

## 2021-07-31 NOTE — ED Notes (Signed)
Pt attended and engaged in wrap up group. Pt stated that her goal was to find peace within herself. She shared that she often times is the person that others come to with their problems and now that she is facing issues she does not know how to cope with those feelings. She also struggles with anxiety and is also scheduled to discharge tomorrow. Pt rated her day a 6/10.

## 2021-07-31 NOTE — ED Notes (Addendum)
Patient A&Ox4. Denies intent to harm self/others when asked. Denies A/VH. Patient denies any physical complaints when asked. Pt states, "I feel a little disoriented this morning. I think because the lights are so bright and they gave me something for sleep last night (Trazodone)".  Support and encouragement provided. Routine safety checks conducted according to facility protocol. Encouraged pt to eat some breakfast and drink plenty of fluids to help flush sleep medication. Encouraged patient to notify staff if thoughts of harm toward self or others arise. Patient verbalize understanding and agreement. Patient remains safe and patient verbally contracts for safety at this time. Will continue to monitor.

## 2021-08-01 MED ORDER — HYDROXYZINE HCL 25 MG PO TABS: 25.0000 mg | ORAL_TABLET | Freq: Three times a day (TID) | ORAL | 0 refills | Status: AC | PRN

## 2021-08-01 MED ORDER — ESCITALOPRAM OXALATE 10 MG PO TABS: 10.0000 mg | ORAL_TABLET | Freq: Every day | ORAL | 1 refills | Status: AC

## 2021-08-01 MED ORDER — BUSPIRONE HCL 10 MG PO TABS: 10.0000 mg | ORAL_TABLET | Freq: Two times a day (BID) | ORAL | 0 refills | Status: AC

## 2021-08-01 MED ORDER — MELATONIN 3 MG PO TABS: 3.0000 mg | ORAL_TABLET | Freq: Every evening | ORAL | 0 refills | Status: DC | PRN

## 2021-08-01 NOTE — ED Notes (Signed)
Pt sleeping in no acute distress. RR even and unlabored. Safety maintained. 

## 2021-08-01 NOTE — Discharge Instructions (Signed)

## 2021-08-01 NOTE — ED Notes (Signed)
Pt wanted to speak with nurse about her concerns regarding her being d/c on tomorrow. This nurse sat with patient while she explained how she was scared to go back to aunts house in fear that she would experience the same intense anxiety that led to her admission. PT then ate a peanut butter and jelly sandwich and went to sleep.

## 2021-08-01 NOTE — ED Notes (Signed)
Patient A&O x 4, ambulatory. Patient discharged in no acute distress. Patient denied SI/HI, A/VH upon discharge. Patient verbalized understanding of all discharge instructions explained by staff, to include follow up appointments, RX's and safety plan. Pt belongings returned to patient from locker #27 intact. Patient escorted to lobby via staff for transport to destination. Safety maintained.  

## 2021-08-01 NOTE — ED Notes (Signed)
D:  Patient A&Ox4. Denies intent to harm self/others when asked. Denies A/VH. Patient denies any physical complaints when asked. C/O increased anxiety.      A: Support and encouragement provided. Pt received Vistaril 25mg  for anxiety. Encouraged pt to practice learned coping skills. Pt given material on 99 coping skills. Routine safety checks conducted according to facility protocol. Encouraged patient to notify staff if thoughts of harm toward self or others arise. Patient verbalize understanding and agreement.   R: Patient remains safe and patient verbally contracts for safety at this time. Will continue to monitor.

## 2021-08-01 NOTE — BH Specialist Note (Signed)
Integrated Behavioral Health via Telemedicine Visit  08/01/2021 Katelyn Parker [id number]  Number of Integrated Behavioral Health visits: 2 Session Start time: 9:16  Session End time: 10:19 Total time:  29  Referring Provider: Nolene Bernheim, NP Patient/Family location: Home [facility] Provider location: [facility]  All persons participating in visit: Patient Katelyn Parker and [facility] Denyla Cortese   Types of Service: Individual psychotherapy and Video visit  I connected with Katelyn Parker and/or Katelyn Parker  n/a  via  Telephone or Video Enabled Telemedicine Application  (Video is Caregility application) and verified that I am speaking with the correct person using two identifiers. Discussed confidentiality: Yes   I discussed the limitations of telemedicine and the availability of in person appointments.  Discussed there is a possibility of technology failure and discussed alternative modes of communication if that failure occurs.  I discussed that engaging in this telemedicine visit, they consent to the provision of behavioral healthcare and the services will be billed under their insurance.  Patient and/or legal guardian expressed understanding and consented to Telemedicine visit: Yes   Presenting Concerns: Patient and/or family reports the following symptoms/concerns: Adjusting to new medication upon discharge from hospital; concerned about having a panic attack while asleep last night, with "shaking, dizzy, blurred vision", and used self-coping skills to manage; helps to process time at inpatient [facility] hospital. Pt felt safe and well cared-for at the hospital, and realizes that she has "so much to live for"; family supportive.  Duration of problem: ; Severity of problem: severe  Patient and/or Family's Strengths/Protective Factors: Social connections, Concrete supports in place (healthy food, safe environments, etc.), and Sense of  purpose  Goals Addressed: Patient will:  Reduce symptoms of: anxiety, depression, insomnia, and stress   Demonstrate ability to: Increase healthy adjustment to current life circumstances  Progress towards Goals: Ongoing  Interventions: Interventions utilized:  Medication Monitoring and Supportive Reflection Standardized Assessments completed: Not Needed  Patient and/or Family Response: Pt agrees with treatment plan  Assessment: Patient currently experiencing Major depressive disorder, severe and Generalized anxiety disorder (both as diagnosed by psychiatry) and Acute stress reaction.   Patient may benefit from continued psychoeducation and brief therapeutic interventions regarding coping with symptoms of anxiety, depression, insomnia, stress .  Plan: Follow up with behavioral health clinician on : Two weeks; Call Asher Muir as needed at [phone] Behavioral recommendations:  -Continue taking BH medication as prescribed (consider setting timer on phone as reminders to take at same times daily) -Continue using self-coping strategies daily to help manage emotions -Remember to be kind to yourself every day, as your body adjusts to new medication Referral(s): Integrated [name] (In Clinic)  I discussed the assessment and treatment plan with the patient and/or parent/guardian. They were provided an opportunity to ask questions and all were answered. They agreed with the plan and demonstrated an understanding of the instructions.   They were advised to call back or seek an in-person evaluation if the symptoms worsen or if the condition fails to improve as anticipated.  [name], LCSW  Depression screen [facility] 2/9 07/23/2021  Decreased Interest 2  Down, Depressed, Hopeless 2  PHQ - 2 Score 4  Altered sleeping 3  Tired, decreased energy 2  Change in appetite 3  Feeling bad or failure about yourself  0  Trouble concentrating 2  Moving slowly or fidgety/restless  1  Suicidal thoughts 0  PHQ-9 Score 15   GAD 7 : Generalized Anxiety  Score 07/23/2021  Nervous, Anxious, on Edge 3  Control/stop worrying 3  Worry too much - different things 3  Trouble relaxing 3  Restless 3  Easily annoyed or irritable 3  Afraid - awful might happen 3  Total GAD 7 Score 21

## 2021-08-01 NOTE — ED Notes (Signed)
Pt is in room sleeping, respirations are even/unlabored, environment check complete/secure, will continue to monitor patient for safety 
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
## 2021-08-01 NOTE — ED Notes (Addendum)
Pt stated that she does not like taking the trazodone to sleep, stating it makes her feel weird. This nurse advised patient that she could take Melatonin as a substitute. This nurse reached out to [place] ([place]) to advise of the patients request and asked him to put in an order for melatonin for the patient.
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
## 2021-08-02 ENCOUNTER — Ambulatory Visit (INDEPENDENT_AMBULATORY_CARE_PROVIDER_SITE_OTHER): Payer: 59 | Admitting: Clinical

## 2021-08-02 DIAGNOSIS — F322 Major depressive disorder, single episode, severe without psychotic features: Secondary | ICD-10-CM

## 2021-08-02 DIAGNOSIS — F411 Generalized anxiety disorder: Secondary | ICD-10-CM

## 2021-08-02 DIAGNOSIS — F430 Acute stress reaction: Secondary | ICD-10-CM

## 2021-08-03 NOTE — BH Specialist Note (Deleted)
Integrated Behavioral Health via Telemedicine Visit  08/03/2021 Rosland Riding [id number]  Number of Integrated Behavioral Health visits: *** Session Start time: 10:15***  Session End time: 11:15*** Total time: {IBH Total Time:21014050}  Referring Provider: *** Patient/Family location: *** [facility] Provider location: *** All persons participating in visit: *** Types of Service: {CHL AMB TYPE OF [phone]}  I connected with [name] and/or Adele Schilder Nath's {family members:20773} via  Telephone or Video Enabled Telemedicine Application  (Video is Caregility application) and verified that I am speaking with the correct person using two identifiers. Discussed confidentiality: {YES/NO:21197}  I discussed the limitations of telemedicine and the availability of in person appointments.  Discussed there is a possibility of technology failure and discussed alternative modes of communication if that failure occurs.  I discussed that engaging in this telemedicine visit, they consent to the provision of behavioral healthcare and the services will be billed under their insurance.  Patient and/or legal guardian expressed understanding and consented to Telemedicine visit: {YES/NO:21197}  Presenting Concerns: Patient and/or family reports the following symptoms/concerns: *** Duration of problem: ***; Severity of problem: {Mild/Moderate/Severe:20260}  Patient and/or Family's Strengths/Protective Factors: {CHL AMB BH PROTECTIVE [phone]}  Goals Addressed: Patient will:  Reduce symptoms of: {IBH Symptoms:21014056}   Increase knowledge and/or ability of: {IBH Patient Tools:21014057}   Demonstrate ability to: {IBH Goals:21014053}  Progress towards Goals: {CHL AMB BH PROGRESS TOWARDS GOALS:[phone]}  Interventions: Interventions utilized:  {IBH Interventions:21014054} Standardized Assessments completed: {IBH Screening Tools:21014051}  Patient and/or Family Response:  ***  Assessment: Patient currently experiencing ***.   Patient may benefit from ***.  Plan: Follow up with behavioral health clinician on : *** Behavioral recommendations: *** Referral(s): {IBH Referrals:21014055}  I discussed the assessment and treatment plan with the patient and/or parent/guardian. They were provided an opportunity to ask questions and all were answered. They agreed with the plan and demonstrated an understanding of the instructions.   They were advised to call back or seek an in-person evaluation if the symptoms worsen or if the condition fails to improve as anticipated.  Valetta Close Maebel Marasco, LCSW

## 2021-08-09 ENCOUNTER — Other Ambulatory Visit: Payer: Self-pay

## 2021-08-09 ENCOUNTER — Other Ambulatory Visit (HOSPITAL_COMMUNITY): Payer: 59 | Attending: Psychiatry | Admitting: Professional

## 2021-08-09 DIAGNOSIS — F411 Generalized anxiety disorder: Secondary | ICD-10-CM

## 2021-08-17 NOTE — BH Specialist Note (Signed)
Pt did not arrive to video visit and did not answer the phone ; unable to leave voice message as "mailbox is full"; ; left MyChart message for patient.   

## 2021-08-24 ENCOUNTER — Ambulatory Visit: Payer: 59 | Admitting: Clinical

## 2021-08-24 DIAGNOSIS — Z5329 Procedure and treatment not carried out because of patient's decision for other reasons: Secondary | ICD-10-CM

## 2021-08-24 DIAGNOSIS — Z91199 Patient's noncompliance with other medical treatment and regimen due to unspecified reason: Secondary | ICD-10-CM

## 2021-08-28 ENCOUNTER — Ambulatory Visit: Payer: 59 | Admitting: Nurse Practitioner

## 2021-11-06 ENCOUNTER — Other Ambulatory Visit: Payer: Self-pay | Admitting: Nurse Practitioner

## 2022-03-18 ENCOUNTER — Encounter (HOSPITAL_COMMUNITY): Payer: Self-pay | Admitting: Emergency Medicine

## 2022-03-18 ENCOUNTER — Other Ambulatory Visit: Payer: Self-pay

## 2022-03-18 ENCOUNTER — Emergency Department (HOSPITAL_COMMUNITY)
Admission: EM | Admit: 2022-03-18 | Discharge: 2022-03-18 | Disposition: A | Discharge status: Home / Self Care | Payer: 59 | Attending: Physician Assistant | Admitting: Physician Assistant

## 2022-03-18 DIAGNOSIS — J029 Acute pharyngitis, unspecified: Secondary | ICD-10-CM | POA: Insufficient documentation

## 2022-03-18 DIAGNOSIS — Z20822 Contact with and (suspected) exposure to covid-19: Secondary | ICD-10-CM | POA: Insufficient documentation

## 2022-03-18 DIAGNOSIS — R519 Headache, unspecified: Secondary | ICD-10-CM | POA: Insufficient documentation

## 2022-03-18 DIAGNOSIS — Z5321 Procedure and treatment not carried out due to patient leaving prior to being seen by health care provider: Secondary | ICD-10-CM | POA: Insufficient documentation

## 2022-03-18 LAB — RESP PANEL BY RT-PCR (FLU A&B, COVID) ARPGX2
Influenza A by PCR: NEGATIVE
Influenza B by PCR: NEGATIVE
SARS Coronavirus 2 by RT PCR: NEGATIVE

## 2022-03-18 LAB — GROUP A STREP BY PCR: Group A Strep by PCR: NOT DETECTED

## 2022-03-18 NOTE — ED Provider Triage Note (Signed)
Emergency Medicine Provider Triage Evaluation Note ? ?[name] , a 26 y.o. female  was evaluated in triage.  Pt complains of 4 to 5 days of sore throat, body aches, headache.  Has been taking over-the-counter medicines with little relief.  Denies any drooling.  Denies any vision changes.  Has also endorsed intermittent nausea but no vomiting.  No known sick contacts. ? ?Review of Systems  ?Positive: As above ?Negative: As above ? ?Physical Exam  ?BP 106/68   Pulse 99   Temp 99 ?F (37.2 ?C) (Oral)   Resp 18   SpO2 97%  ?Gen:   Awake, no distress   ?Resp:  Normal effort  ?MSK:   Moves extremities without difficulty  ?Other:  Oropharynx mild erythematous, no significant tonsillar exudate noted, uvula midline, tolerating secretions, speaking full sentences, no sublingual swelling ? ?Medical Decision Making  ?Medically screening exam initiated at 12:44 AM.  Appropriate orders placed.  Velencia Lenart was informed that the remainder of the evaluation will be completed by another provider, this initial triage assessment does not replace that evaluation, and the importance of remaining in the ED until their evaluation is complete. ? ? ?  ?Mare Ferrari, PA-C ?03/18/22 0045 ? ?

## 2022-03-18 NOTE — ED Triage Notes (Signed)
Pt reported to ED with c/o sore throat and headache x 4 days. States she has been taking OTC medication with no relief in symptoms.  ?

## 2022-08-16 IMAGING — DX DG CHEST 2V
2 series · 2 of 2 positions shown · non-contrast
Comparison: None.

CLINICAL DATA: Cough

EXAM:
CHEST - 2 VIEW

[chest pa]
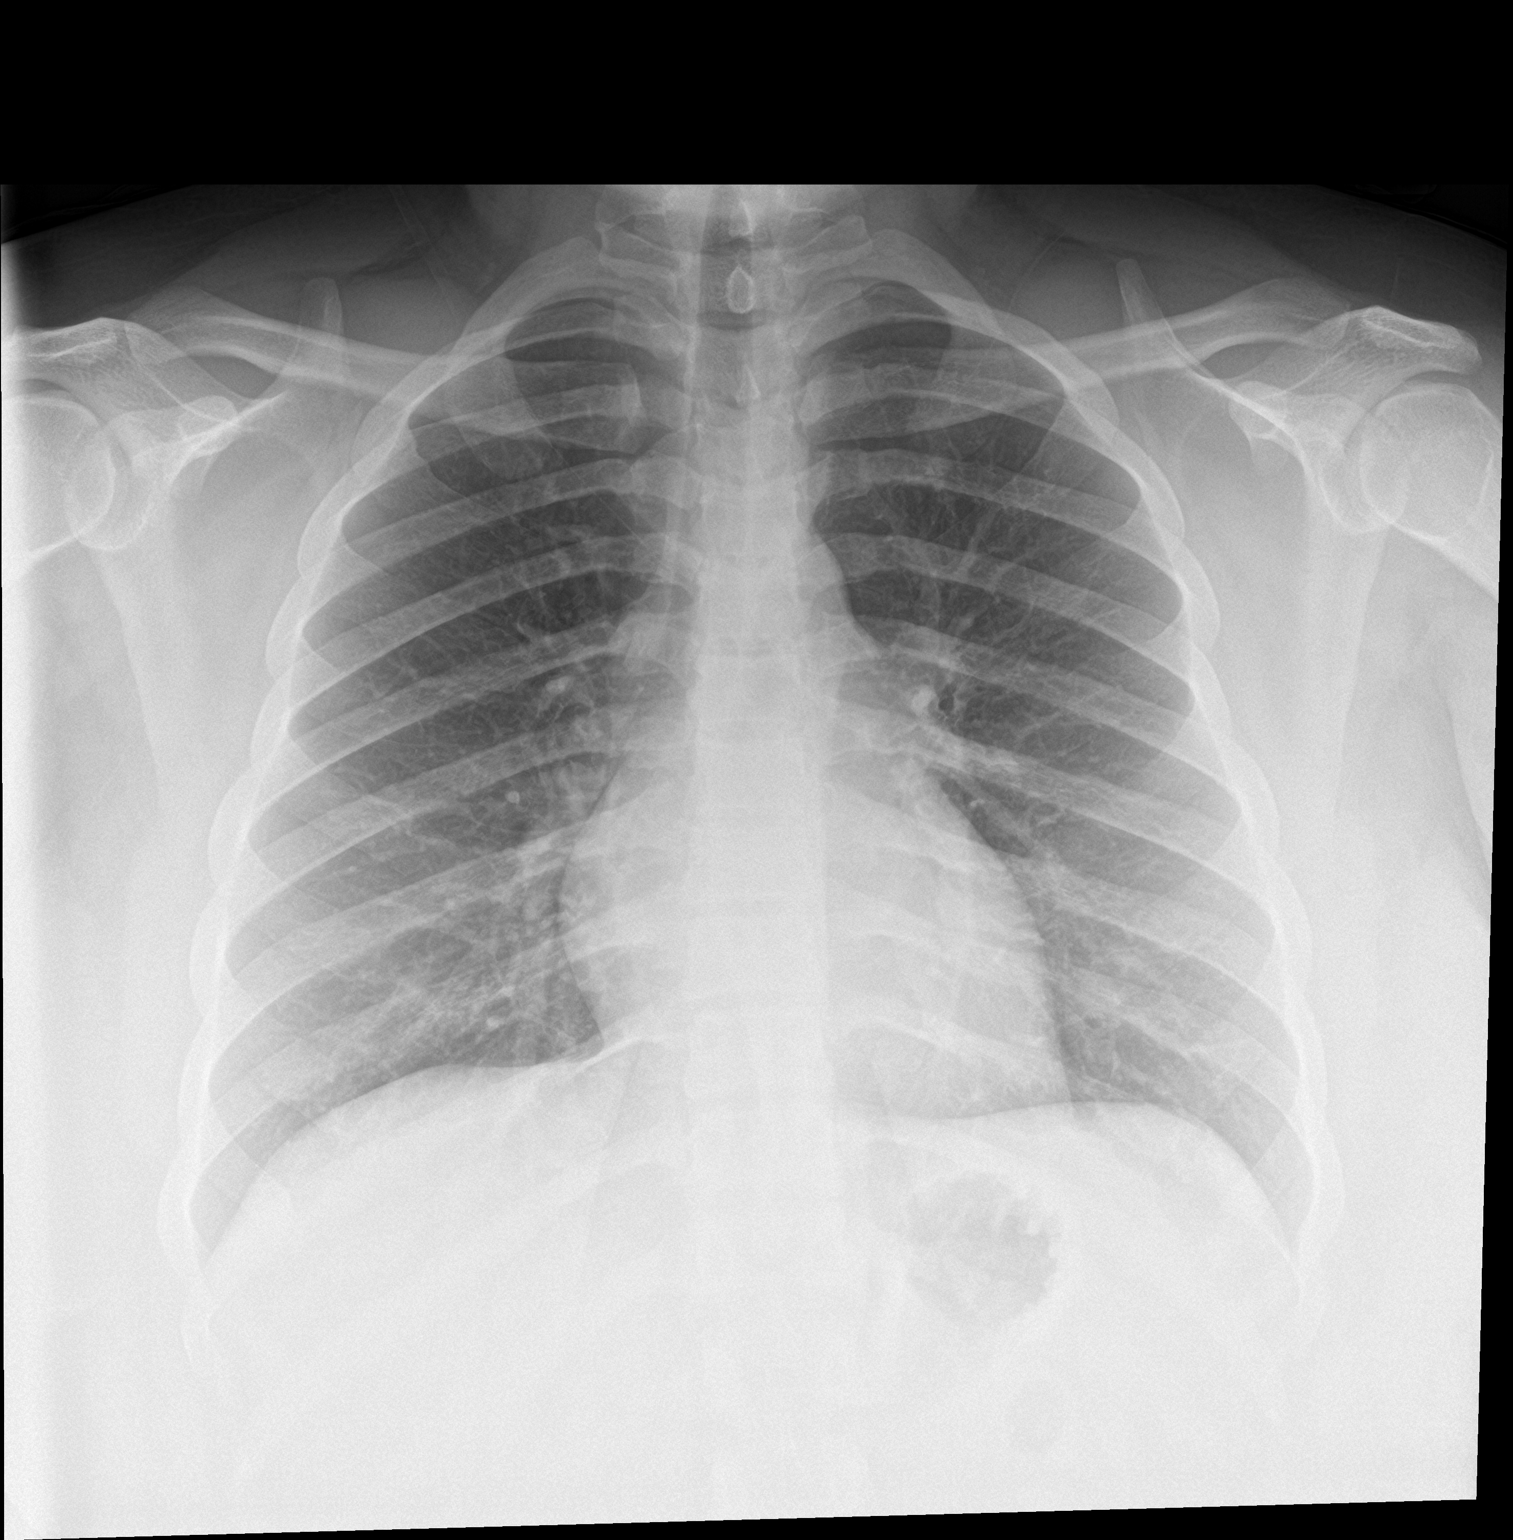

[chest lat]
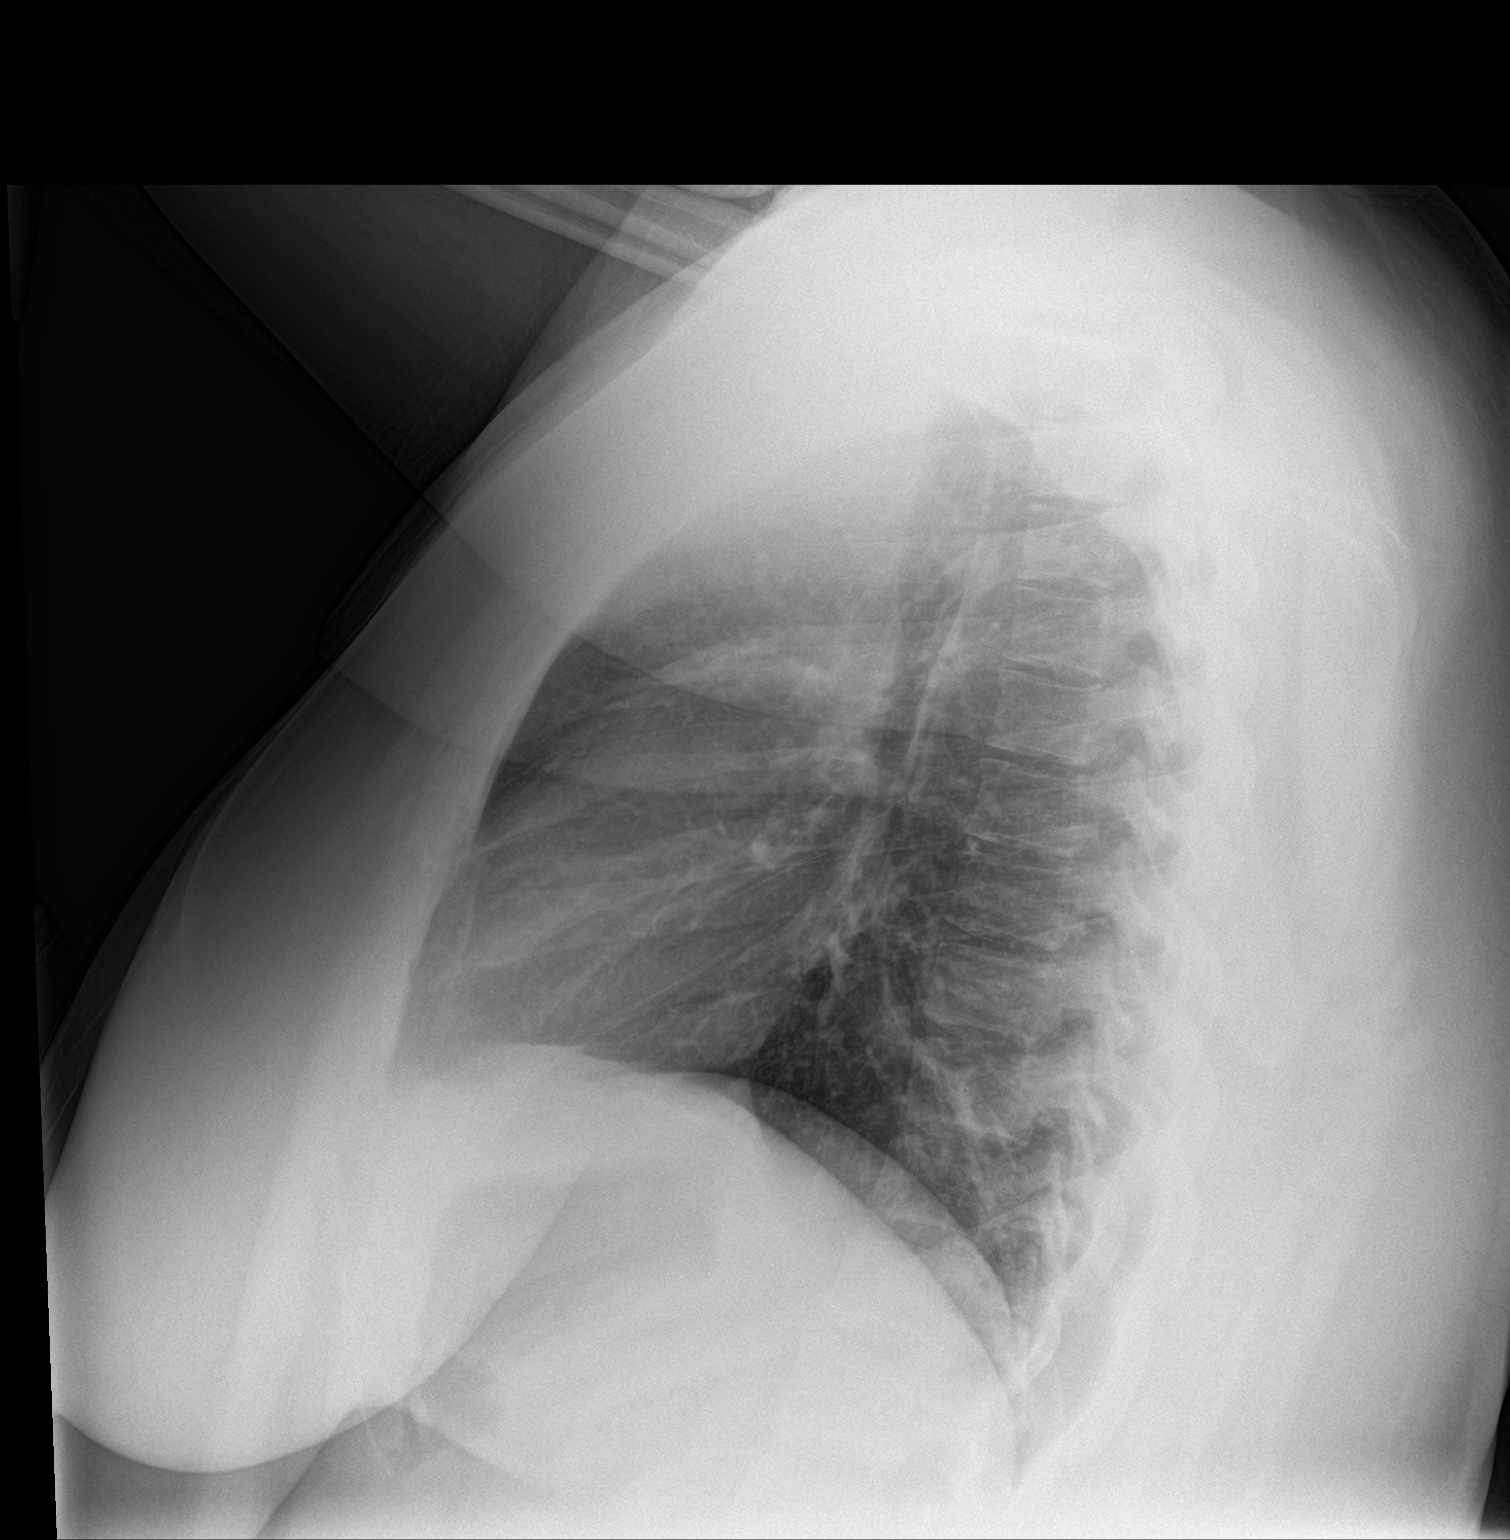

[2 of 2 positions shown; findings below may reference images not displayed]

FINDINGS: Normal heart size and mediastinal contours. No acute infiltrate or
edema. No effusion or pneumothorax. No acute osseous findings.
IMPRESSION: Negative for pneumonia.

## 2022-11-05 ENCOUNTER — Telehealth: Payer: Self-pay

## 2022-11-05 ENCOUNTER — Ambulatory Visit (INDEPENDENT_AMBULATORY_CARE_PROVIDER_SITE_OTHER): Payer: Commercial Managed Care - HMO | Admitting: Allergy and Immunology

## 2022-11-05 ENCOUNTER — Encounter: Payer: Self-pay | Admitting: Allergy and Immunology

## 2022-11-05 ENCOUNTER — Other Ambulatory Visit: Payer: Self-pay

## 2022-11-05 VITALS — BP 132/90 | HR 82 | Temp 98.4°F | Resp 16 | Ht 63.25 in | Wt 284.5 lb

## 2022-11-05 DIAGNOSIS — T7840XA Allergy, unspecified, initial encounter: Secondary | ICD-10-CM | POA: Diagnosis not present

## 2022-11-05 DIAGNOSIS — T783XXA Angioneurotic edema, initial encounter: Secondary | ICD-10-CM

## 2022-11-05 DIAGNOSIS — J3089 Other allergic rhinitis: Secondary | ICD-10-CM

## 2022-11-05 DIAGNOSIS — L500 Allergic urticaria: Secondary | ICD-10-CM

## 2022-11-05 DIAGNOSIS — T782XXA Anaphylactic shock, unspecified, initial encounter: Secondary | ICD-10-CM

## 2022-11-05 DIAGNOSIS — J301 Allergic rhinitis due to pollen: Secondary | ICD-10-CM

## 2022-11-05 MED ORDER — EPINEPHRINE 0.3 MG/0.3ML IJ SOAJ: 0.3000 mg | INTRAMUSCULAR | 1 refills | Status: DC | PRN

## 2022-11-05 MED ORDER — MONTELUKAST SODIUM 10 MG PO TABS: 10.0000 mg | ORAL_TABLET | Freq: Every evening | ORAL | 5 refills | Status: DC

## 2022-11-05 MED ORDER — OLOPATADINE HCL 0.2 % OP SOLN: 1.0000 [drp] | OPHTHALMIC | 5 refills | Status: AC

## 2022-11-05 MED ORDER — CETIRIZINE HCL 10 MG PO TABS
10.0000 mg | ORAL_TABLET | Freq: Every day | ORAL | 5 refills | Status: DC | PRN
Start: 2022-11-05 — End: 2023-02-18

## 2022-11-05 NOTE — Patient Instructions (Addendum)
  1.  Allergen avoidance measures  2.  Use a preventative plan directed at recurrent allergic reactions and allergic disease:   A.  Cetirizine 10 mg -1 tablet 1 time per day  B.  Montelukast 10 mg -1 tablet 1 time per day  3.  If needed:   A.  EpiPen, Benadryl, MD/ER evaluation for allergic reaction  B.  Pataday -1 drop each eye 1 time per day  C.  Over-the-counter moisturizer  4.  Blood - C4, CBC w/d, CMP, TSH, FT4, thyroid peroxidase ab, alpha-gal panel  5. Return to clinic in 4 weeks or earlier if problem

## 2022-11-05 NOTE — Progress Notes (Unsigned)
[place] [facility] [place] - [place] - [place] - [place]   NEW PATIENT NOTE  Referring Provider: No ref. provider found Primary Provider: Pcp, No Date of office visit: 11/05/2022    Subjective:   Chief Complaint:  Katelyn Parker (DOB: 1995-12-04) is a 27 y.o. female who presents to the clinic on 11/05/2022 with a chief complaint of Allergy Testing (States she suffers from facial swelling, itching, hives appear after cleaning. ) .     HPI: [name] presents to this clinic in evaluation of recurrent allergic reactions.  She states that sometime in 2021 she has developed recurrent episodes of swollen face and puffy eyes and she has had a least 10 these episodes since 2021 with her last episode occurring about 2 weeks ago.  There is no associated systemic or constitutional symptoms.  These episodes last about 8 to 12 hours.  There is no obvious trigger giving rise to this issue.  And she has had about 10 episodes of hives manifested as red itchy blotchy areas across her body that has developed since 2022.  She required emergency room evaluation this past summer for one of her global outbreaks and she required administration of systemic steroid during that evaluation.  Once again there is no associated systemic or constitutional symptoms.  Her skin lesions never heal with scar hyperpigmentation.  She does have a history of developing problems with sneezing and watery eyes and maybe some slight puffy face for which she will take some Benadryl.  She also has a history of eczema that is improved significantly while using over-the-counter moisturizer.  No past medical history on file.  No past surgical history on file.  Allergies as of 11/05/2022   No Known Allergies      Medication List    busPIRone 10 MG tablet Commonly known as: BUSPAR Take 1 tablet (10 mg total) by mouth 2 (two) times daily.   escitalopram 10 MG tablet Commonly known as: Lexapro Take 1 tablet (10 mg total) by  mouth daily.   hydrOXYzine 25 MG tablet Commonly known as: ATARAX Take 1 tablet (25 mg total) by mouth 3 (three) times daily as needed for anxiety.    Review of systems negative except as noted in HPI / PMHx or noted below:  Review of Systems  Constitutional: Negative.   HENT: Negative.    Eyes: Negative.   Respiratory: Negative.    Cardiovascular: Negative.   Gastrointestinal: Negative.   Genitourinary: Negative.   Musculoskeletal: Negative.   Skin: Negative.   Neurological: Negative.   Endo/Heme/Allergies: Negative.   Psychiatric/Behavioral: Negative.      Family History  Problem Relation Age of Onset   Allergic rhinitis Mother    Asthma Sister     Social History   Socioeconomic History   Marital status: Single    Spouse name: Not on file   Number of children: Not on file   Years of education: Not on file   Highest education level: Not on file  Occupational History   Not on file  Tobacco Use   Smoking status: Never   Smokeless tobacco: Never  Vaping Use   Vaping Use: Former   Substances: Nicotine, Flavoring  Substance and Sexual Activity   Alcohol use: Yes   Drug use: Not Currently   Sexual activity: Yes  Other Topics Concern   Not on file  Social History Narrative   Not on file   Environmental and Social history  Lives in a apartment with a dry environment, a  dog located inside the household, carpet in the bedroom, no plastic on the bed, no plastic on the pillow, and no smoking ongoing with inside the household.  Objective:   Vitals:   11/05/22 1404  BP: (!) 132/90  Pulse: 82  Resp: 16  Temp: 98.4 F (36.9 C)  SpO2: 99%   Height: 5' 3.25" (160.7 cm) Weight: 284 lb 8 oz (129 kg)  Physical Exam Constitutional:      Appearance: She is not diaphoretic.     Comments: Nasal crease  HENT:     Head: Normocephalic.     Right Ear: Tympanic membrane, ear canal and external ear normal.     Left Ear: Tympanic membrane, ear canal and external ear  normal.     Nose: Nose normal. No mucosal edema or rhinorrhea.     Mouth/Throat:     Pharynx: Uvula midline. No oropharyngeal exudate.  Eyes:     Conjunctiva/sclera: Conjunctivae normal.  Neck:     Thyroid: No thyromegaly.     Trachea: Trachea normal. No tracheal tenderness or tracheal deviation.  Cardiovascular:     Rate and Rhythm: Normal rate and regular rhythm.     Heart sounds: Normal heart sounds, S1 normal and S2 normal. No murmur heard. Pulmonary:     Effort: No respiratory distress.     Breath sounds: Normal breath sounds. No stridor. No wheezing or rales.  Lymphadenopathy:     Head:     Right side of head: No tonsillar adenopathy.     Left side of head: No tonsillar adenopathy.     Cervical: No cervical adenopathy.  Skin:    Findings: No erythema or rash.     Nails: There is no clubbing.  Neurological:     Mental Status: She is alert.     Diagnostics: Allergy skin tests were performed.  She had hypersensitivity against house dust mite, cat, trees, grasses, weeds, Alternaria.  She also demonstrated hypersensitivity against multiple foods including hazelnut, [place] nut, pistachio, hops, rice, white potato, sweet potato, cabbage, carrots, cucumber, grape, peach, cantaloupe.  Soon after completion of skin testing she developed copious amounts of nasal discharge and sneezing and abdominal upset for which we gave her epinephrine and Benadryl and her symptoms resolved within approximately 15 to 20 minutes.  Assessment and Plan:    1. Allergic reaction, initial encounter   2. Allergic urticaria   3. Angioedema, initial encounter   4. Perennial allergic rhinitis   5. Seasonal allergic rhinitis due to pollen   6. Anaphylaxis, initial encounter    1.  Allergen avoidance measures - dust mites, pollens, alternaria, tree nuts, fresh fruit / vegetables, others???  2.  Use a preventative plan directed at recurrent allergic reactions and allergic disease:   A.  Cetirizine 10 mg  -1 tablet 1 time per day  B.  Montelukast 10 mg -1 tablet 1 time per day  3.  If needed:   A.  EpiPen, Benadryl, MD/ER evaluation for allergic reaction  B.  Pataday -1 drop each eye 1 time per day  C.  Over-the-counter moisturizer  4.  Blood - C4, CBC w/d, CMP, TSH, FT4, thyroid peroxidase ab, alpha-gal panel, nut panel w/R  5. Return to clinic in 4 weeks or earlier if problem  Toccara has a very active immune system with an atopic phenotype and I suspect that all of her symptoms are coming about from her atopic disease but to be complete we will obtain the blood test noted above  and further investigation of these issues.  I think that one of the best things that we can do for her as we move forward is to somehow get her started on anti-IgE antibody and a course of immunotherapy.  I will see her back in this clinic in 4 weeks or earlier if there is a problem.  Jessica Priest, MD Allergy / Immunology [facility]

## 2022-11-05 NOTE — Telephone Encounter (Signed)
Patient came in today as a new patient for skin testing. Patient completed skin tested and completed her intradermals.  When patients timer went off for her intradermal testing patient verbalized to Dr. Lucie Leather that she wasn't feeling the best. Dr. Lucie Leather ordered 25 ml of liquid benadryl. Moments later Dr. Lucie Leather ordered 0.3 ml of epi. Patient was injected with epi at 4:11 pm in room 9. Vital were taken and patient was heavily monitored for an hour as well as vital taken every ten minutes. Dr. Lucie Leather released patient after an hour of observation.  I called patient around 6 pm to check on her. Patient expressed that she was ok and at home resting with a headache. I informed patient that she could take tylenol to help with the headache. Patient expressed that she was taken home due to feeling sleepy and not wanting to drive home.   Patient was advised about calling the office if she needed to speak to a provider on call tonight or calling 911 in case of an emergency. Patient was advised that we would call her tomorrow to follow up.  Please call patient to check on her to see how she is doing. Patients emergency chart in in the pending holder in the larger suite near the lab.

## 2022-11-06 ENCOUNTER — Encounter: Payer: Self-pay | Admitting: Allergy and Immunology

## 2022-11-06 NOTE — Telephone Encounter (Signed)
Called and left a voicemail asking for patient to return call to see how she is doing.  

## 2022-11-06 NOTE — Telephone Encounter (Signed)
Called and spoke with the patient to see how she is doing. She states that she feels better from yesterday but is still having fatigue and headache and has been taking tylenol every 6 hours. She also states that her back is still sore from yesterday. I did advise that Dr. Lucie Leather wanted to add on more labs but they were not able to be added to the labs drawn yesterday, patient verbalized understanding and will come by tomorrow to have labs drawn. She did state that she took Zyrtec last night and then later in the evening took the Montelukast for the first time and within 10-15 minutes she began to experience breathing issues, headache, and her throat felt funny. She stated that she sat up and tried to relax and drink some water and eventually fell asleep. I advised to not take anymore Montelukast until I spoke with Dr. Lucie Leather. Patient verbalized understanding.

## 2022-11-06 NOTE — Addendum Note (Signed)
Addended by: Dollene Cleveland R on: 11/06/2022 12:32 PM   Modules accepted: Orders

## 2022-11-07 ENCOUNTER — Other Ambulatory Visit: Payer: Self-pay

## 2022-11-07 ENCOUNTER — Encounter: Payer: Self-pay | Admitting: Family Medicine

## 2022-11-07 ENCOUNTER — Ambulatory Visit (INDEPENDENT_AMBULATORY_CARE_PROVIDER_SITE_OTHER): Payer: Commercial Managed Care - HMO | Admitting: Family Medicine

## 2022-11-07 VITALS — BP 120/82 | HR 80 | Temp 98.0°F | Resp 80 | Ht 63.0 in | Wt 284.6 lb

## 2022-11-07 DIAGNOSIS — J301 Allergic rhinitis due to pollen: Secondary | ICD-10-CM

## 2022-11-07 DIAGNOSIS — J3089 Other allergic rhinitis: Secondary | ICD-10-CM

## 2022-11-07 DIAGNOSIS — L500 Allergic urticaria: Secondary | ICD-10-CM | POA: Diagnosis not present

## 2022-11-07 DIAGNOSIS — T782XXA Anaphylactic shock, unspecified, initial encounter: Secondary | ICD-10-CM

## 2022-11-07 DIAGNOSIS — T7840XA Allergy, unspecified, initial encounter: Secondary | ICD-10-CM

## 2022-11-07 DIAGNOSIS — T783XXA Angioneurotic edema, initial encounter: Secondary | ICD-10-CM

## 2022-11-07 DIAGNOSIS — T7840XD Allergy, unspecified, subsequent encounter: Secondary | ICD-10-CM

## 2022-11-07 NOTE — Telephone Encounter (Signed)
Called and spoke with patient and she is going to come in today to be seen with Thurston Hole. She did not have access to a COVID test. I did advise for her to wear a mask when she comes in just to be on the safe side. Patient verbalized understanding.

## 2022-11-07 NOTE — Patient Instructions (Addendum)
  1.  Allergen avoidance measures - dust mites, pollens, alternaria, tree nuts, fresh fruit / vegetables, others???   2.  Use a preventative plan directed at recurrent allergic reactions and allergic disease:   A.  Cetirizine 10 mg -1 tablet 1-2 times a day    3.  If needed:   A.  EpiPen, Benadryl, MD/ER evaluation for allergic reaction  B.  Pataday -1 drop each eye 1 time per day  C.  Over-the-counter moisturizer  4.  Blood - we will call you when the results become available  5. Stop montelukast  6. GI referral may be necessary  7. Return to clinic in 4 weeks or earlier if problem 

## 2022-11-08 ENCOUNTER — Encounter: Payer: Self-pay | Admitting: Family Medicine

## 2022-11-08 DIAGNOSIS — J301 Allergic rhinitis due to pollen: Secondary | ICD-10-CM | POA: Insufficient documentation

## 2022-11-08 DIAGNOSIS — J3089 Other allergic rhinitis: Secondary | ICD-10-CM | POA: Insufficient documentation

## 2022-11-08 DIAGNOSIS — T783XXA Angioneurotic edema, initial encounter: Secondary | ICD-10-CM | POA: Insufficient documentation

## 2022-11-08 DIAGNOSIS — T782XXA Anaphylactic shock, unspecified, initial encounter: Secondary | ICD-10-CM | POA: Insufficient documentation

## 2022-11-08 DIAGNOSIS — T7840XA Allergy, unspecified, initial encounter: Secondary | ICD-10-CM | POA: Insufficient documentation

## 2022-11-08 DIAGNOSIS — L500 Allergic urticaria: Secondary | ICD-10-CM | POA: Insufficient documentation

## 2022-11-08 LAB — COMPREHENSIVE METABOLIC PANEL
ALT: 10 IU/L (ref 0–32)
AST: 16 IU/L (ref 0–40)
Albumin/Globulin Ratio: 1.3 (ref 1.2–2.2)
Albumin: 3.3 g/dL — ABNORMAL LOW (ref 4.0–5.0)
Alkaline Phosphatase: 61 IU/L (ref 44–121)
BUN/Creatinine Ratio: 11 (ref 9–23)
BUN: 9 mg/dL (ref 6–20)
Bilirubin Total: 0.2 mg/dL (ref 0.0–1.2)
CO2: 21 mmol/L (ref 20–29)
Calcium: 8.6 mg/dL — ABNORMAL LOW (ref 8.7–10.2)
Chloride: 106 mmol/L (ref 96–106)
Creatinine, Ser: 0.8 mg/dL (ref 0.57–1.00)
Globulin, Total: 2.5 g/dL (ref 1.5–4.5)
Glucose: 89 mg/dL (ref 70–99)
Potassium: 4.2 mmol/L (ref 3.5–5.2)
Sodium: 141 mmol/L (ref 134–144)
Total Protein: 5.8 g/dL — ABNORMAL LOW (ref 6.0–8.5)
eGFR: 105 mL/min/{1.73_m2} (ref >59)

## 2022-11-08 LAB — CBC WITH DIFFERENTIAL
Basophils Absolute: 0 10*3/uL (ref 0.0–0.2)
Basos: 1 %
EOS (ABSOLUTE): 0.5 10*3/uL — ABNORMAL HIGH (ref 0.0–0.4)
Eos: 8 %
Hematocrit: 42.5 % (ref 34.0–46.6)
Hemoglobin: 13.9 g/dL (ref 11.1–15.9)
Immature Grans (Abs): 0 10*3/uL (ref 0.0–0.1)
Immature Granulocytes: 0 %
Lymphocytes Absolute: 1.7 10*3/uL (ref 0.7–3.1)
Lymphs: 29 %
MCH: 27.8 pg (ref 26.6–33.0)
MCHC: 32.7 g/dL (ref 31.5–35.7)
MCV: 85 fL (ref 79–97)
Monocytes Absolute: 0.5 10*3/uL (ref 0.1–0.9)
Monocytes: 9 %
Neutrophils Absolute: 3.1 10*3/uL (ref 1.4–7.0)
Neutrophils: 53 %
RBC: 5 x10E6/uL (ref 3.77–5.28)
RDW: 13.1 % (ref 11.7–15.4)
WBC: 5.8 10*3/uL (ref 3.4–10.8)

## 2022-11-08 LAB — ALPHA-GAL PANEL
Allergen Lamb IgE: 0.13 kU/L — AB
Beef IgE: <0.1 kU/L
IgE (Immunoglobulin E), Serum: 591 IU/mL — ABNORMAL HIGH (ref 6–495)
O215-IgE Alpha-Gal: <0.1 kU/L
Pork IgE: <0.1 kU/L

## 2022-11-08 LAB — C4 COMPLEMENT: Complement C4, Serum: 26 mg/dL (ref 12–38)

## 2022-11-08 LAB — THYROID PEROXIDASE ANTIBODY: Thyroperoxidase Ab SerPl-aCnc: 12 IU/mL (ref 0–34)

## 2022-11-08 LAB — T4, FREE: Free T4: 1.21 ng/dL (ref 0.82–1.77)

## 2022-11-08 LAB — TSH: TSH: 1.86 u[IU]/mL (ref 0.450–4.500)

## 2022-11-08 NOTE — Progress Notes (Signed)
[address] [place] [address] Dept: [phone]  FOLLOW UP NOTE  Patient ID: Katelyn Parker, female    DOB: 22-Sep-1996  Age: 26 y.o. MRN: [id number] Date of Office Visit: 11/07/2022  Assessment  Chief Complaint: Allergic Reaction (Medication Reaction - Patient states she was fine until she took the Montelukast 10 mg at bedtime. Patient stated 20-30 min taking medication she began to feel shaky, felt like her throat was closing and some shortness of breath. Patient stated due to not being on anxiety medication  - she didn't know if she was having a panic attack or allergic reaction to the medication.)  HPI Katelyn Parker is a 26 year old female who presents to the clinic for follow-up visit.  She was last seen in this clinic on 11/05/2022 by Dr. Lucie Leather for evaluation of allergic rhinitis, urticaria, angioedema, and food allergy.  At that time, she experienced symptoms of anaphylaxis and required epinephrine injection for relief of symptoms.  She reports that later that day she was experiencing some anxiety and took a shower and began to listen to a meditation program.  Shortly after she took montelukast just before bed.  She reports that about 20 minutes after taking montelukast she began to experience throat closing which she described as feeling like somebody had placed their hand on her throat and was squeezing and some shortness of breath for which she sat upright and performed some breathing exercises until these symptoms passed which took at least 30 minutes.  She denies concomitant integumentary or gastrointestinal symptoms.  She considered using epinephrine at that time, however, she decided not to use the EpiPen.  She does report that she has experienced these symptoms previously on a much milder level.  She reports she has recently moved to [place] and has not been able to find a primary care doctor to prescribe medications for anxiety at this time.  She does report that she has  an appointment for tomorrow to attempt to get some medications in place to help her relieve her symptoms of anxiety.  She reports that she continues to experience intermittent angioedema in her face for which she continues cetirizine and occasionally Benadryl.  She continues to avoid tree nuts, hops, rice, potato, cabbage, carrot, cucumber, grape, peach, and cantaloupe with no accidental ingestion or EpiPen use since her last visit to this clinic.  She does report frequently getting food stuck in her throat and frequently feels as though food is not digesting.  She reports that she has not previously seen a gastroenterologist.  Her current medications are listed in the chart.   Drug Allergies:  No Known Allergies  Physical Exam: BP 120/82   Pulse 80   Temp 98 F (36.7 C)   Resp (!) 80   Ht 5\' 3"  (1.6 m)   Wt 284 lb 9.6 oz (129.1 kg)   SpO2 (!) 16%   BMI 50.41 kg/m    Physical Exam Vitals reviewed.  Constitutional:      Appearance: Normal appearance.  HENT:     Head: Normocephalic and atraumatic.     Right Ear: Tympanic membrane normal.     Left Ear: Tympanic membrane normal.     Nose:     Comments: Bilateral nares slightly erythematous with clear nasal drainage noted.  Pharynx normal.  Ears normal.  Eyes normal.    Mouth/Throat:     Pharynx: Oropharynx is clear.  Eyes:     Conjunctiva/sclera: Conjunctivae normal.  Cardiovascular:  Rate and Rhythm: Normal rate and regular rhythm.     Heart sounds: Normal heart sounds. No murmur heard. Pulmonary:     Effort: Pulmonary effort is normal.     Breath sounds: Normal breath sounds.     Comments: Lungs clear to auscultation Musculoskeletal:        General: Normal range of motion.     Cervical back: Normal range of motion and neck supple.  Skin:    General: Skin is warm and dry.  Neurological:     Mental Status: She is alert and oriented to person, place, and time.  Psychiatric:        Mood and Affect: Mood normal.         Behavior: Behavior normal.        Thought Content: Thought content normal.        Judgment: Judgment normal.       Assessment and Plan: 1. Allergic reaction, initial encounter   2. Allergic urticaria   3. Angioedema, initial encounter   4. Seasonal allergic rhinitis due to pollen   5. Anaphylaxis, initial encounter   6. Perennial allergic rhinitis     Patient Instructions   1.  Allergen avoidance measures - dust mites, pollens, alternaria, tree nuts, fresh fruit / vegetables, others???   2.  Use a preventative plan directed at recurrent allergic reactions and allergic disease:   A.  Cetirizine 10 mg -1 tablet 1-2 times a day    3.  If needed:   A.  EpiPen, Benadryl, MD/ER evaluation for allergic reaction  B.  Pataday -1 drop each eye 1 time per day  C.  Over-the-counter moisturizer  4.  Blood - we will call you when the results become available  5. Stop montelukast  6. GI referral may be necessary  7. Return to clinic in 4 weeks or earlier if problem  Return in about 4 weeks (around 12/05/2022), or if symptoms worsen or fail to improve.    Thank you for the opportunity to care for this patient.  Please do not hesitate to contact me with questions.  Thermon Leyland, FNP [facility]

## 2022-11-12 ENCOUNTER — Encounter: Payer: Self-pay | Admitting: *Deleted

## 2022-11-12 LAB — PANEL 604350: Ber E 1 IgE: <0.1 kU/L

## 2022-11-12 LAB — ALLERGEN COMPONENT COMMENTS

## 2022-11-12 LAB — PEANUT COMPONENTS
F352-IgE Ara h 8: 0.1 kU/L — AB
F422-IgE Ara h 1: <0.1 kU/L
F423-IgE Ara h 2: 0.13 kU/L — AB
F424-IgE Ara h 3: <0.1 kU/L
F427-IgE Ara h 9: 4.99 kU/L — AB
F447-IgE Ara h 6: <0.1 kU/L

## 2022-11-12 LAB — IGE NUT PROF. W/COMPONENT RFLX
F017-IgE Hazelnut (Filbert): 9.36 kU/L — AB
F018-IgE Brazil Nut: 4.05 kU/L — AB
F020-IgE Almond: 9.28 kU/L — AB
F202-IgE Cashew Nut: 0.27 kU/L — AB
F203-IgE Pistachio Nut: 11.8 kU/L — AB
F256-IgE Walnut: 6.96 kU/L — AB
Macadamia Nut, IgE: 11.7 kU/L — AB
Peanut, IgE: 13.6 kU/L — AB
Pecan Nut IgE: 2.19 kU/L — AB

## 2022-11-12 LAB — PANEL 604726
Cor A 1 IgE: 0.11 kU/L — AB
Cor A 14 IgE: <0.1 kU/L
Cor A 8 IgE: 2.98 kU/L — AB
Cor A 9 IgE: 2.67 kU/L — AB

## 2022-11-12 LAB — PANEL 604721
Jug R 1 IgE: 0.17 kU/L — AB
Jug R 3 IgE: 1.04 kU/L — AB

## 2022-11-12 LAB — PANEL 604239: ANA O 3 IgE: <0.1 kU/L

## 2022-11-20 ENCOUNTER — Telehealth: Payer: Self-pay | Admitting: Clinical

## 2022-11-20 NOTE — Telephone Encounter (Signed)
Return pt call; pt requesting appointment after moving back to [place] from [place]; also out of BH medication (Lexapro, Buspar; Hydroxyzine); requests refills. Pt is informed that [facility] does not prescribe medications, but is recommended she use [facility] walk-in hours to see psychiatry for [facility] medication management. Pt does not have PCP. Pt agrees to receive information on MyChart message about walk-in hours at [facility] and some PCP options in the area; agrees to check her insurance to see who is in network. Pt also agrees to initial consult visit with [facility] and will call back as needed prior to schedule visit.

## 2022-11-21 NOTE — BH Specialist Note (Signed)
Integrated Behavioral Health via Telemedicine Visit  12/05/2022 Nance Tubman [id number]  Number of [place] Clinician visits: 1- Initial Visit  Session Start time: [id number]   Session End time: [id number]  Total time in minutes: 60   Referring Provider: Earlie Server, NP Patient/Family location: [place]/[facility] Provider location: Center for [place] at [facility]  All persons participating in visit: Patient Katelyn Parker and [place]   Types of Service: Individual psychotherapy and Video visit  I connected with [name] and/or [name]  n/a  via  Telephone or Video Enabled Telemedicine Application  (Video is Caregility application) and verified that I am speaking with the correct person using two identifiers. Discussed confidentiality: Yes   I discussed the limitations of telemedicine and the availability of in person appointments.  Discussed there is a possibility of technology failure and discussed alternative modes of communication if that failure occurs.  I discussed that engaging in this telemedicine visit, they consent to the provision of behavioral healthcare and the services will be billed under their insurance.  Patient and/or legal guardian expressed understanding and consented to Telemedicine visit: Yes   Presenting Concerns: Patient and/or family reports the following symptoms/concerns: Processing experience of passing out during allergy testing and finding out she's allergic to various trees, weeds, pollens, dust, rice, white and sweet potatoes, canteloupe, cabbage, carrots, peaches, pistachio, [place] nuts, hazelnuts, etc. Pt has not been on anxiety medication since return to [place] from [place](after leaving due to a series of traumatic events). Pt's goals are to 1. Manage anxiety; 2. Manage allergies; 3. Complete FAFSA form; 4. Apply to local university with physical therapy program; 5. Find PCP Duration of problem:  Increase in anxiety over time; Severity of problem:  moderately severe  Patient and/or Family's Strengths/Protective Factors: Social connections, Concrete supports in place (healthy food, safe environments, etc.), and Sense of purpose  Goals Addressed: Patient will:  Reduce symptoms of: anxiety   Increase knowledge and/or ability of: stress reduction   Demonstrate ability to: Increase healthy adjustment to current life circumstances and Increase motivation to adhere to plan of care  Progress towards Goals: Ongoing  Interventions: Interventions utilized:  Supportive Reflection Standardized Assessments completed: Not Needed  Patient and/or Family Response: n/a  Assessment: Patient currently experiencing Generalized anxiety disorder.   Patient may benefit from psychoeducation and brief therapeutic interventions regarding coping with symptoms of anxiety. Pt would also benefit from additional assessment at follow up visit.  .  Plan: Follow up with behavioral health clinician on : Two weeks Behavioral recommendations:  -Go to [facility] walk-in hours to establish with psychiatry for [facility] medication management -Continue taking allergy medications as prescribed; following allergist recommendations to reduce allergy outbreaks -Continue plan to complete FAFSA form as soon as possible -Look into local university programs: compare to see which is the best fit; each university may help with FAFSA completion (ex. [employer], [employer], [employer], etc.) -Establish with PCP of choice Referral(s): [place] (In Clinic)  I discussed the assessment and treatment plan with the patient and/or parent/guardian. They were provided an opportunity to ask questions and all were answered. They agreed with the plan and demonstrated an understanding of the instructions.   They were advised to call back or seek an in-person evaluation if the symptoms worsen or if the condition fails to  improve as anticipated.  [place], LCSW     08/09/2021    2:42 PM 07/23/2021   11:54 AM  Depression screen PHQ 2/9  Decreased Interest  2  Down, Depressed, Hopeless  2  PHQ - 2 Score  4  Altered sleeping  3  Tired, decreased energy  2  Change in appetite  3  Feeling bad or failure about yourself   0  Trouble concentrating  2  Moving slowly or fidgety/restless  1  Suicidal thoughts  0  PHQ-9 Score  15  Difficult doing work/chores       Information is confidential and restricted. Go to Review Flowsheets to unlock data.      07/23/2021   11:54 AM  GAD 7 : Generalized Anxiety Score  Nervous, Anxious, on Edge 3  Control/stop worrying 3  Worry too much - different things 3  Trouble relaxing 3  Restless 3  Easily annoyed or irritable 3  Afraid - awful might happen 3  Total GAD 7 Score 21

## 2022-12-03 ENCOUNTER — Ambulatory Visit: Payer: Commercial Managed Care - HMO | Admitting: Allergy and Immunology

## 2022-12-03 DIAGNOSIS — J309 Allergic rhinitis, unspecified: Secondary | ICD-10-CM

## 2022-12-05 ENCOUNTER — Ambulatory Visit (INDEPENDENT_AMBULATORY_CARE_PROVIDER_SITE_OTHER): Payer: Self-pay | Admitting: Clinical

## 2022-12-05 DIAGNOSIS — F411 Generalized anxiety disorder: Secondary | ICD-10-CM

## 2022-12-05 NOTE — Patient Instructions (Signed)
Center for [facility] at [facility] [place], [place] [address] [phone] (main office) [phone] ([place] office)  [place] primary care offices possibly accepting new patients:    [place]  [address] [place], [place] [address] [phone] *Nurse Practitioner currently accepting new patients, must stop by the office to fill out a new pt packet   [facility]  [place], [place] [address] [phone]  Primary Care at  [facility] [address] [place], [place] [address] [phone]  [place] at [facility] [place], [place] [address] [place] and [facility] [address] [place], [place] [address] [place] [address] [place] [address] [phone]  Patient [place] [place],  [place]  [address] [place]  [address], [place], [place] [address] [phone] or [phone] [facility] 24/7 FOR ANYONE [address], [place], [place] Fax: [phone] [email or web address] *Interpreters available *Accepts all insurance and uninsured for Urgent Care needs *Accepts Medicaid and uninsured for outpatient treatment (below)    ONLY FOR [facility]  Below:   Outpatient New Patient Assessment/Therapy Walk-ins:        Monday -Thursday 8am until slots are full.        Every Friday 1pm-4pm  (first come, first served)                   New Patient Psychiatry/Medication Management        Monday-Friday 8am-11am (first come, first served)              For all walk-ins we ask that you arrive by 7:15am, because patients will be seen in the order of arrival.

## 2022-12-09 ENCOUNTER — Telehealth: Payer: Self-pay | Admitting: Family Medicine

## 2022-12-09 ENCOUNTER — Ambulatory Visit: Payer: Commercial Managed Care - HMO | Admitting: Family Medicine

## 2022-12-09 DIAGNOSIS — J309 Allergic rhinitis, unspecified: Secondary | ICD-10-CM

## 2022-12-09 NOTE — Patient Instructions (Incomplete)
  1.  Allergen avoidance measures - dust mites, pollens, alternaria, tree nuts, fresh fruit / vegetables, others???   2.  Use a preventative plan directed at recurrent allergic reactions and allergic disease:   A.  Cetirizine 10 mg -1 tablet 1-2 times a day    3.  If needed:   A.  EpiPen, Benadryl, MD/ER evaluation for allergic reaction  B.  Pataday -1 drop each eye 1 time per day  C.  Over-the-counter moisturizer  4.  Blood - we will call you when the results become available  5. Stop montelukast  6. GI referral may be necessary  7. Return to clinic in 4 weeks or earlier if problem

## 2022-12-09 NOTE — Telephone Encounter (Signed)
LMOM for patient to call the clinic after she missed her appointment this morning.

## 2022-12-09 NOTE — Progress Notes (Deleted)
   [place] [address] Dept: [id number]  FOLLOW UP NOTE  Patient ID: Katelyn Parker, female    DOB: [birth year]  Age: 27 y.o. MRN: [id number] Date of Office Visit: 12/09/2022  Assessment  Chief Complaint: No chief complaint on file.  HPI Katelyn Parker is a 27 year old female who presents to the clinic for follow-up visit.  She was previously seen in this clinic on 11/07/2022 by Gareth Morgan, FNP, for evaluation of allergic rhinitis, urticaria, angioedema, allergic reaction, and food allergy to tree nuts, hops, rice, potato, cabbage, carrot, cucumber, grape, peach, and cantaloupe.  Her last environmental allergy testing was on 11/05/2022 was positive to grass pollen, weed pollen, tree pollen, mold, dust mite, and cat.     Drug Allergies:  No Known Allergies  Physical Exam: There were no vitals taken for this visit.   Physical Exam  Diagnostics:    Assessment and Plan: No diagnosis found.  No orders of the defined types were placed in this encounter.   There are no Patient Instructions on file for this visit.  No follow-ups on file.    Thank you for the opportunity to care for this patient.  Please do not hesitate to contact me with questions.  Gareth Morgan, FNP [facility] [place] [facility] [place]

## 2022-12-12 NOTE — BH Specialist Note (Signed)
Pt did not arrive to video visit and did not answer the phone; Left HIPPA-compliant message to call back Margot Oriordan from [facility] at  [phone] (Eisha Chatterjee's office).  ?; left MyChart message for patient.  ? ?

## 2022-12-19 ENCOUNTER — Ambulatory Visit: Payer: Commercial Managed Care - HMO | Admitting: Clinical

## 2022-12-19 DIAGNOSIS — Z91199 Patient's noncompliance with other medical treatment and regimen due to unspecified reason: Secondary | ICD-10-CM

## 2022-12-23 ENCOUNTER — Telehealth: Payer: Self-pay | Admitting: *Deleted

## 2022-12-23 NOTE — Psych (Signed)
Virtual Visit via Video Note  I connected with Katelyn Parker on 08/09/21 at  2:00 PM EDT by a video enabled telemedicine application and verified that I am speaking with the correct person using two identifiers.  Location: Patient: Home Provider: Clinical Home Office   I discussed the limitations of evaluation and management by telemedicine and the availability of in person appointments. The patient expressed understanding and agreed to proceed.  Follow Up Instructions:    I discussed the assessment and treatment plan with the patient. The patient was provided an opportunity to ask questions and all were answered. The patient agreed with the plan and demonstrated an understanding of the instructions.   The patient was advised to call back or seek an in-person evaluation if the symptoms worsen or if the condition fails to improve as anticipated.  I provided 10 minutes of non-face-to-face time during this encounter.   Royetta Crochet, [facility]  Cln orients pt to PHP. Pt declines due to work schedule. Pt will f/u with individual if feels she needs it. Pt denies current SI/HI

## 2022-12-23 NOTE — Telephone Encounter (Signed)
L/M for patient Xolair del to clinic 1/25 and can call to make appt to start therapy

## 2022-12-26 ENCOUNTER — Ambulatory Visit (INDEPENDENT_AMBULATORY_CARE_PROVIDER_SITE_OTHER): Payer: Commercial Managed Care - HMO

## 2022-12-26 DIAGNOSIS — L500 Allergic urticaria: Secondary | ICD-10-CM | POA: Diagnosis not present

## 2022-12-26 MED ORDER — OMALIZUMAB 150 MG/ML ~~LOC~~ SOSY
300.0000 mg | PREFILLED_SYRINGE | SUBCUTANEOUS | Status: AC
  Administered 2022-12-26 – 2023-02-18 (×2): 300 mg via SUBCUTANEOUS

## 2022-12-26 NOTE — Progress Notes (Signed)
Immunotherapy   Patient Details  Name: Kamrie Fanton MRN: [id number] Date of Birth: Aug 15, 1996  12/26/2022  [name] started injections for  Xolair Following schedule: Every twenty eight days. Frequency:Every four weeks.  Epi-Pen:Epi-Pen Available  Consent signed in office today and patient instructions given. Patient waited in room twenty seven for thirty minutes without an issue.    Julius Bowels 12/26/2022, 7:57 PM

## 2023-01-01 IMAGING — DX DG CHEST 1V PORT
1 series · 1 of 1 positions shown · non-contrast
Comparison: Chest x-ray dated March 03, 2021.

CLINICAL DATA: Inhalation injury.

EXAM:
PORTABLE CHEST 1 VIEW

[chest]
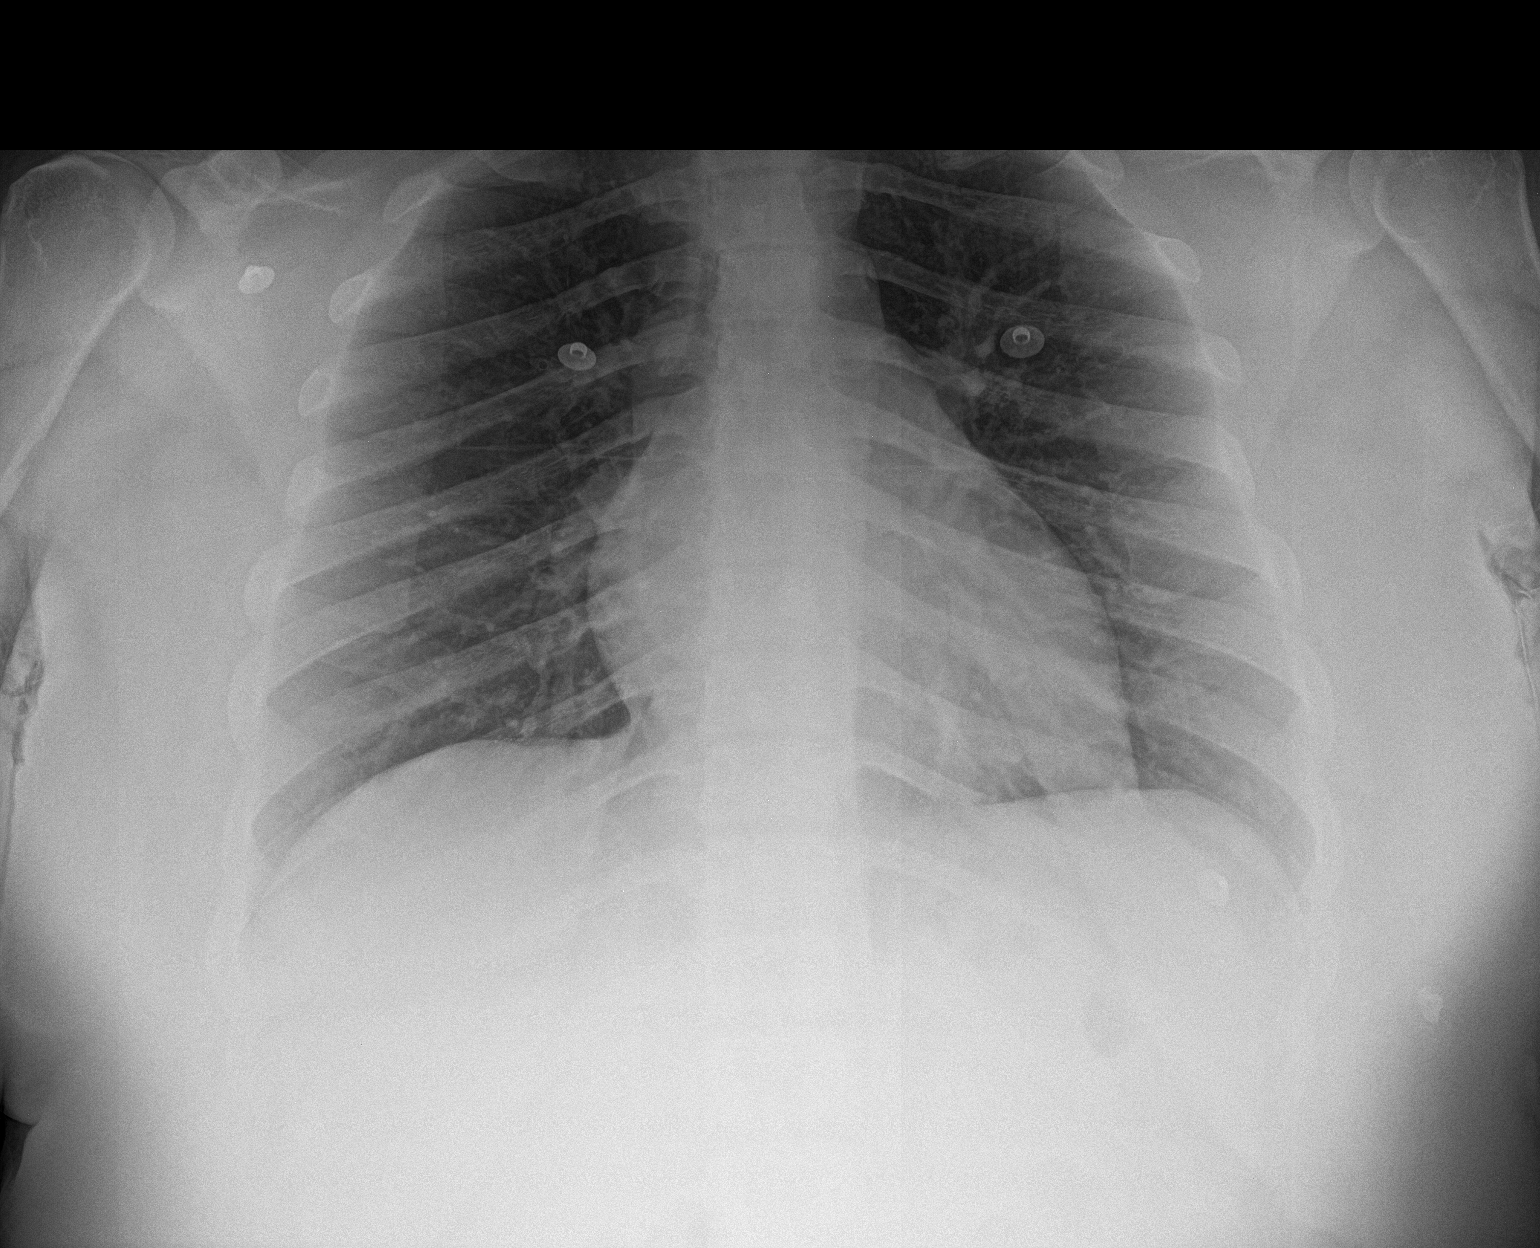

[1 of 1 positions shown; findings below may reference images not displayed]

FINDINGS: The heart size and mediastinal contours are within normal limits.
Both lungs are clear. The visualized skeletal structures are
unremarkable.
IMPRESSION: No active disease.

## 2023-01-20 NOTE — BH Specialist Note (Signed)
Integrated Behavioral Health via Telemedicine Visit  02/03/2023 Katelyn Parker [id number]  Number of Integrated Behavioral Health Clinician visits: 2- Second Visit  Session Start time: [id number]   Session End time: 1132  Total time in minutes: 44   Referring Provider: Elson Clan, NP Patient/Family location: Home Katelyn Parker Provider location: [facility] [place] [facility]  All persons participating in visit: Patient Katelyn Parker and Katelyn Parker   Types of Service: Individual psychotherapy and Video visit  I connected with Katelyn Parker and/or Katelyn Parker  n/a  via  Telephone or Video Enabled Telemedicine Application  (Video is Caregility application) and verified that I am speaking with the correct person using two identifiers. Discussed confidentiality: Yes   I discussed the limitations of telemedicine and the availability of in person appointments.  Discussed there is a possibility of technology failure and discussed alternative modes of communication if that failure occurs.  I discussed that engaging in this telemedicine visit, they consent to the provision of behavioral healthcare and the services will be billed under their insurance.  Patient and/or legal guardian expressed understanding and consented to Telemedicine visit: Yes   Presenting Concerns: Patient and/or family reports the following symptoms/concerns: Processing stress of switching to a new health insurance and not being able to continue medications during lapse; pain in her arm's rotator cuff, severe allergies (difficulty breathing at night) preventing quality sleep, which negatively affects mood. Pt is coping best with hot showers and herbal tea steams( peppermint, elderberry, everlasting) with cough syrup; has picked up medications as prescribed.  Duration of problem: Ongoing; Severity of problem:  moderately severe  Patient and/or Family's Strengths/Protective  Factors: Social connections, Concrete supports in place (healthy food, safe environments, etc.), and Sense of purpose  Goals Addressed: Patient will:  Reduce symptoms of: anxiety and stress   Increase knowledge and/or ability of: stress reduction   Demonstrate ability to: Increase healthy adjustment to current life circumstances  Progress towards Goals: Ongoing  Interventions: Interventions utilized:  Solution-Focused Strategies Standardized Assessments completed: GAD-7 and PHQ 9  Patient and/or Family Response: Patient agrees with treatment plan.   Assessment: Patient currently experiencing Generalized anxiety disorder and Psychosocial stress.   Patient may benefit from psychoeducation and brief therapeutic interventions regarding coping with symptoms of anxiety and life stress .  Plan: Follow up with behavioral health clinician on : Two weeks Behavioral recommendations:  -Continue plan to use [facility] Outpatient walk-in to establish with psychiatry for [facility] medication management as soon as able -Continue taking allergy medications as prescribed; following up with allergist at scheduled appointment -Continue plan to establish with new PCP of choice -Continue plan to decide upon a university program and apply for [facility] as soon as able Referral(s): Integrated Orthoptist (In Clinic) and [place] (LME/Outside Clinic)  I discussed the assessment and treatment plan with the patient and/or parent/guardian. They were provided an opportunity to ask questions and all were answered. They agreed with the plan and demonstrated an understanding of the instructions.   They were advised to call back or seek an in-person evaluation if the symptoms worsen or if the condition fails to improve as anticipated.  Katelyn Parker Katelyn Arai, LCSW     02/03/2023   11:01 AM 08/09/2021    2:42 PM 07/23/2021   11:54 AM  Depression screen PHQ 2/9  Decreased Interest 1  2  Down,  Depressed, Hopeless 1  2  PHQ - 2 Score 2  4  Altered sleeping 3  3  Tired, decreased energy 3  2  Change in appetite 3  3  Feeling bad or failure about yourself  0  0  Trouble concentrating 1  2  Moving slowly or fidgety/restless 0  1  Suicidal thoughts 0  0  PHQ-9 Score 12  15  Difficult doing work/chores        Information is confidential and restricted. Go to Review Flowsheets to unlock data.       02/03/2023   11:04 AM 07/23/2021   11:54 AM  GAD 7 : Generalized Anxiety Score  Nervous, Anxious, on Edge 3 3  Control/stop worrying 3 3  Worry too much - different things 3 3  Trouble relaxing 3 3  Restless 1 3  Easily annoyed or irritable 1 3  Afraid - awful might happen 0 3  Total GAD 7 Score 14 21

## 2023-01-21 ENCOUNTER — Ambulatory Visit: Payer: Commercial Managed Care - HMO

## 2023-01-21 ENCOUNTER — Ambulatory Visit: Payer: Commercial Managed Care - HMO | Admitting: Allergy and Immunology

## 2023-02-03 ENCOUNTER — Ambulatory Visit (INDEPENDENT_AMBULATORY_CARE_PROVIDER_SITE_OTHER): Payer: Commercial Managed Care - HMO | Admitting: Clinical

## 2023-02-03 DIAGNOSIS — Z658 Other specified problems related to psychosocial circumstances: Secondary | ICD-10-CM

## 2023-02-03 DIAGNOSIS — F411 Generalized anxiety disorder: Secondary | ICD-10-CM

## 2023-02-04 NOTE — BH Specialist Note (Signed)
Pt did not arrive to video visit and did not answer the phone; Left HIPPA-compliant message to call back Aliz Meritt from [facility] at  [phone] (Mala Gibbard's office).  ?; left MyChart message for patient.  ? ?

## 2023-02-17 ENCOUNTER — Ambulatory Visit: Payer: Self-pay | Admitting: Clinical

## 2023-02-17 DIAGNOSIS — Z91199 Patient's noncompliance with other medical treatment and regimen due to unspecified reason: Secondary | ICD-10-CM

## 2023-02-18 ENCOUNTER — Ambulatory Visit (INDEPENDENT_AMBULATORY_CARE_PROVIDER_SITE_OTHER): Payer: BLUE CROSS/BLUE SHIELD | Admitting: Allergy and Immunology

## 2023-02-18 ENCOUNTER — Encounter: Payer: Self-pay | Admitting: Allergy and Immunology

## 2023-02-18 ENCOUNTER — Other Ambulatory Visit: Payer: Self-pay | Admitting: Allergy and Immunology

## 2023-02-18 ENCOUNTER — Ambulatory Visit: Payer: BLUE CROSS/BLUE SHIELD | Admitting: Allergy and Immunology

## 2023-02-18 VITALS — BP 136/84 | HR 99 | Temp 98.1°F | Resp 18 | Ht 63.0 in | Wt 291.0 lb

## 2023-02-18 DIAGNOSIS — T7840XD Allergy, unspecified, subsequent encounter: Secondary | ICD-10-CM

## 2023-02-18 DIAGNOSIS — J3089 Other allergic rhinitis: Secondary | ICD-10-CM

## 2023-02-18 DIAGNOSIS — J301 Allergic rhinitis due to pollen: Secondary | ICD-10-CM | POA: Diagnosis not present

## 2023-02-18 DIAGNOSIS — J4550 Severe persistent asthma, uncomplicated: Secondary | ICD-10-CM | POA: Diagnosis not present

## 2023-02-18 DIAGNOSIS — L501 Idiopathic urticaria: Secondary | ICD-10-CM

## 2023-02-18 DIAGNOSIS — J309 Allergic rhinitis, unspecified: Secondary | ICD-10-CM

## 2023-02-18 MED ORDER — FAMOTIDINE 40 MG PO TABS: 40.0000 mg | ORAL_TABLET | Freq: Every morning | ORAL | 1 refills | Status: AC

## 2023-02-18 MED ORDER — BREZTRI AEROSPHERE 160-9-4.8 MCG/ACT IN AERO: 2.0000 | INHALATION_SPRAY | Freq: Two times a day (BID) | RESPIRATORY_TRACT | 1 refills | Status: AC

## 2023-02-18 MED ORDER — SPACER/AERO-HOLDING CHAMBERS DEVI: 1.0000 | 2 refills | Status: AC

## 2023-02-18 MED ORDER — EPINEPHRINE 0.3 MG/0.3ML IJ SOAJ: 0.3000 mg | INTRAMUSCULAR | 1 refills | Status: AC | PRN

## 2023-02-18 MED ORDER — AIRSUPRA 90-80 MCG/ACT IN AERO: 2.0000 | INHALATION_SPRAY | RESPIRATORY_TRACT | 1 refills | Status: DC | PRN

## 2023-02-18 MED ORDER — RYALTRIS 665-25 MCG/ACT NA SUSP: 2.0000 | Freq: Two times a day (BID) | NASAL | 1 refills | Status: DC

## 2023-02-18 MED ORDER — CETIRIZINE HCL 10 MG PO TABS
20.0000 mg | ORAL_TABLET | Freq: Two times a day (BID) | ORAL | 1 refills | Status: AC
Start: 2023-02-18 — End: ?

## 2023-02-18 NOTE — Progress Notes (Signed)
[place] [facility] [place] [facility] [place]   Follow-up Note  Referring Provider: No ref. provider found Primary Provider: Pcp, No Date of Office Visit: 02/18/2023  Subjective:   Katelyn Parker (DOB: 08/19/1996) is a 27 y.o. female who returns to the [facility] [place] on 02/18/2023 in re-evaluation of the following:  HPI: Katelyn Parker returns to this clinic in evaluation of allergic rhinitis and recurrent urticaria and angioedema and very bad food allergy directed against a multitude of various foods and also tied up with oral allergy syndrome.  I last saw her in this clinic for initial evaluation 05 November 2022.  We attempted to get her on omalizumab but because of an insurance issue she only had 1 injection and has not followed through with consistent injections of this medication for her overactive immune system.  As expected she has had lots of problems with her airway.  Most recently she has developed wheezing and coughing requiring urgent care evaluation last week and the administration of albuterol and given prednisone.  Unfortunately she was intolerant of using more than 40 mg of prednisone for 2 days because of CNS upset and GI upset.  Basically she remains in masks with her airway with lots of nasal congestion and sniffing and snorting and sneezing and now wheezing and coughing.  In addition her urticaria is better with using 20 mg of cetirizine per day.  She has not had to use an EpiPen.  Allergies as of 02/18/2023   No Known Allergies      Medication List    albuterol 108 (90 Base) MCG/ACT inhaler Commonly known as: VENTOLIN HFA Inhale 2 puffs into the lungs every 4 (four) hours as needed.   busPIRone 10 MG tablet Commonly known as: BUSPAR Take 1 tablet (10 mg total) by mouth 2 (two) times daily.   cetirizine 10 MG tablet Commonly known as: ZYRTEC Take 1 tablet (10 mg total) by mouth daily as needed for allergies (Can take an extra dose during  flare ups.).   EPINEPHrine 0.3 mg/0.3 mL Soaj injection Commonly known as: EpiPen 2-Pak Inject 0.3 mg into the muscle as needed for anaphylaxis.   escitalopram 10 MG tablet Commonly known as: Lexapro Take 1 tablet (10 mg total) by mouth daily.   hydrOXYzine 25 MG tablet Commonly known as: ATARAX Take 1 tablet (25 mg total) by mouth 3 (three) times daily as needed for anxiety.   Olopatadine HCl 0.2 % Soln Commonly known as: Pataday Place 1 drop into both eyes 1 day or 1 dose.    No past medical history on file.  No past surgical history on file.  Review of systems negative except as noted in HPI / PMHx or noted below:  Review of Systems  Constitutional: Negative.   HENT: Negative.    Eyes: Negative.   Respiratory: Negative.    Cardiovascular: Negative.   Gastrointestinal: Negative.   Genitourinary: Negative.   Musculoskeletal: Negative.   Skin: Negative.   Neurological: Negative.   Endo/Heme/Allergies: Negative.   Psychiatric/Behavioral: Negative.       Objective:   Vitals:   02/18/23 1523  BP: 136/84  Pulse: 99  Resp: 18  Temp: 98.1 F (36.7 C)  SpO2: 99%   Height: 5\' 3"  (160 cm)  Weight: 291 lb (132 kg)   Physical Exam Constitutional:      Appearance: She is not diaphoretic.     Comments: Sneezing, coughing  HENT:     Head: Normocephalic.  Right Ear: Tympanic membrane, ear canal and external ear normal.     Left Ear: Tympanic membrane, ear canal and external ear normal.     Nose: Mucosal edema present. No rhinorrhea.     Comments: Nasal crease    Mouth/Throat:     Pharynx: Uvula midline. No oropharyngeal exudate.  Eyes:     Conjunctiva/sclera: Conjunctivae normal.  Neck:     Thyroid: No thyromegaly.     Trachea: Trachea normal. No tracheal tenderness or tracheal deviation.  Cardiovascular:     Rate and Rhythm: Normal rate and regular rhythm.     Heart sounds: Normal heart sounds, S1 normal and S2 normal. No murmur heard. Pulmonary:      Effort: No respiratory distress.     Breath sounds: No stridor. Wheezing present. No rales.  Lymphadenopathy:     Head:     Right side of head: No tonsillar adenopathy.     Left side of head: No tonsillar adenopathy.     Cervical: No cervical adenopathy.  Skin:    Findings: No erythema or rash.     Nails: There is no clubbing.  Neurological:     Mental Status: She is alert.    Diagnostics:    Spirometry was performed and demonstrated an FEV1 of *** at *** % of predicted.  Assessment and Plan:   No diagnosis found.  1.  Allergen avoidance measures - dust mites, pollens, alternaria, tree nuts, fresh fruit / vegetables   2.  Use a preventative plan directed at recurrent allergic reactions     A.  Cetirizine 10 mg -1-2 tablet 1-2 times a day (MAX=40mg /day)  B.  Famotidine 40 mg -1 tablet 1 time per day  C.  Omalizumab injections today and every 4 weeks  3. Use a preventative plan directed at inflammation or airway:   A. Ryaltris - 2 sprays each nostril 2 times per day (SP)  B. Breztri - 2 inhalations 2 times per day w/ spacer (empty lungs)   4.  If needed:   A.  EpiPen, Benadryl, MD/ER evaluation for allergic reaction  B.  Pataday -1 drop each eye 1 time per day  C.  Over-the-counter moisturizer  D.  AirSupra - 2 inhalations every 4-6 hours (coupon)  5. Return to clinic in 4 weeks or earlier if problem  Katelyn Parker appears to not have good control of her multiorgan atopic disease and hopefully with the plan noted above which includes a collection of anti-inflammatory agents for airway and I plan to address her overactive immune system including the use of anti-IgE antibody we will get her under control over the next several weeks.  I will see her back in this clinic in 4 weeks.  Allena Katz, MD Allergy / Immunology [place]

## 2023-02-18 NOTE — Patient Instructions (Addendum)
  1.  Allergen avoidance measures - dust mites, pollens, alternaria, tree nuts, fresh fruit / vegetables   2.  Use a preventative plan directed at recurrent allergic reactions     A.  Cetirizine 10 mg -1-2 tablet 1-2 times a day (MAX=40mg /day)  B.  Famotidine 40 mg -1 tablet 1 time per day  C.  Omalizumab injections today and every 4 weeks  3. Use a preventative plan directed at inflammation or airway:   A. Ryaltris - 2 sprays each nostril 2 times per day (SP)  B. Breztri - 2 inhalations 2 times per day w/ spacer (empty lungs)   4.  If needed:   A.  EpiPen, Benadryl, MD/ER evaluation for allergic reaction  B.  Pataday -1 drop each eye 1 time per day  C.  Over-the-counter moisturizer  D.  AirSupra - 2 inhalations every 4-6 hours (coupon)  5. Return to clinic in 4 weeks or earlier if problem

## 2023-02-19 ENCOUNTER — Telehealth: Payer: Self-pay | Admitting: *Deleted

## 2023-02-19 ENCOUNTER — Encounter: Payer: Self-pay | Admitting: Allergy and Immunology

## 2023-02-19 NOTE — Telephone Encounter (Signed)
Called patient and advised unable to see her in clinic with he current Laura local has to see [facility]. I advised her we could refer her or she can go online and sign up for a different plan. Patient wants to change plans ans willr each back out to me with that info

## 2023-02-19 NOTE — Telephone Encounter (Signed)
-----   Message from Jiles Prows, MD sent at 02/19/2023  9:40 AM EDT ----- omalizumab

## 2023-04-08 ENCOUNTER — Ambulatory Visit: Payer: BLUE CROSS/BLUE SHIELD | Admitting: Allergy and Immunology

## 2023-04-08 DIAGNOSIS — J309 Allergic rhinitis, unspecified: Secondary | ICD-10-CM

## 2023-04-23 ENCOUNTER — Emergency Department (HOSPITAL_COMMUNITY)
Admission: EM | Admit: 2023-04-23 | Discharge: 2023-04-23 | Disposition: A | Discharge status: Home / Self Care | Payer: BLUE CROSS/BLUE SHIELD | Attending: Emergency Medicine | Admitting: Emergency Medicine

## 2023-04-23 ENCOUNTER — Other Ambulatory Visit: Payer: Self-pay

## 2023-04-23 DIAGNOSIS — T543X1A Toxic effect of corrosive alkalis and alkali-like substances, accidental (unintentional), initial encounter: Secondary | ICD-10-CM | POA: Diagnosis not present

## 2023-04-23 DIAGNOSIS — R1110 Vomiting, unspecified: Secondary | ICD-10-CM | POA: Diagnosis present

## 2023-04-23 DIAGNOSIS — T5491XA Toxic effect of unspecified corrosive substance, accidental (unintentional), initial encounter: Secondary | ICD-10-CM

## 2023-04-23 LAB — COMPREHENSIVE METABOLIC PANEL
ALT: 18 U/L (ref 0–44)
AST: 27 U/L (ref 15–41)
Albumin: 3.3 g/dL — ABNORMAL LOW (ref 3.5–5.0)
Alkaline Phosphatase: 83 U/L (ref 38–126)
Anion gap: 7 (ref 5–15)
BUN: 10 mg/dL (ref 6–20)
CO2: 24 mmol/L (ref 22–32)
Calcium: 8.7 mg/dL — ABNORMAL LOW (ref 8.9–10.3)
Chloride: 103 mmol/L (ref 98–111)
Creatinine, Ser: 1.09 mg/dL — ABNORMAL HIGH (ref 0.44–1.00)
GFR, Estimated: >60 mL/min (ref >60)
Glucose, Bld: 86 mg/dL (ref 70–99)
Potassium: 4.1 mmol/L (ref 3.5–5.1)
Sodium: 134 mmol/L — ABNORMAL LOW (ref 135–145)
Total Bilirubin: 1.2 mg/dL (ref 0.3–1.2)
Total Protein: 7 g/dL (ref 6.5–8.1)

## 2023-04-23 LAB — CBC WITH DIFFERENTIAL/PLATELET
Abs Immature Granulocytes: 0.02 10*3/uL (ref 0.00–0.07)
Basophils Absolute: 0 10*3/uL (ref 0.0–0.1)
Basophils Relative: 0 %
Eosinophils Absolute: 0.2 10*3/uL (ref 0.0–0.5)
Eosinophils Relative: 3 %
HCT: 40.2 % (ref 36.0–46.0)
Hemoglobin: 13.1 g/dL (ref 12.0–15.0)
Immature Granulocytes: 0 %
Lymphocytes Relative: 28 %
Lymphs Abs: 2.1 10*3/uL (ref 0.7–4.0)
MCH: 28.2 pg (ref 26.0–34.0)
MCHC: 32.6 g/dL (ref 30.0–36.0)
MCV: 86.5 fL (ref 80.0–100.0)
Monocytes Absolute: 0.8 10*3/uL (ref 0.1–1.0)
Monocytes Relative: 11 %
Neutro Abs: 4.4 10*3/uL (ref 1.7–7.7)
Neutrophils Relative %: 58 %
Platelets: 324 10*3/uL (ref 150–400)
RBC: 4.65 MIL/uL (ref 3.87–5.11)
RDW: 13.8 % (ref 11.5–15.5)
WBC: 7.5 10*3/uL (ref 4.0–10.5)
nRBC: 0 % (ref 0.0–0.2)

## 2023-04-23 MED ORDER — LACTATED RINGERS IV BOLUS
1000.0000 mL | Freq: Once | INTRAVENOUS | Status: AC
  Administered 2023-04-23: 1000 mL via INTRAVENOUS

## 2023-04-23 MED ORDER — ONDANSETRON HCL 4 MG/2ML IJ SOLN
4.0000 mg | Freq: Once | INTRAMUSCULAR | Status: AC
  Administered 2023-04-23: 4 mg via INTRAVENOUS
  Filled 2023-04-23: qty 2

## 2023-04-23 MED ORDER — LIDOCAINE VISCOUS HCL 2 % MT SOLN
15.0000 mL | Freq: Once | OROMUCOSAL | Status: AC
  Administered 2023-04-23: 15 mL via OROMUCOSAL
  Filled 2023-04-23: qty 15

## 2023-04-23 MED ORDER — ALUM & MAG HYDROXIDE-SIMETH 200-200-20 MG/5ML PO SUSP
30.0000 mL | Freq: Once | ORAL | Status: AC
  Administered 2023-04-23: 30 mL via ORAL
  Filled 2023-04-23: qty 30

## 2023-04-23 NOTE — ED Triage Notes (Signed)
Patient accidentally drank a cleaning solution at home this morning , reports emesis and diarrhea .

## 2023-04-23 NOTE — ED Provider Notes (Signed)
[name] [facility]ENC[facility] DE[facility]MENT AT [facility] Provider Note   CSN: [id number] Arrival date & time: 04/23/23  7829     History  Chief Complaint  Patient presents with   Emesis [name]    Katelyn Parker is a 27 y.o. female.  Patient presents to the emergency department for evaluation after accidentally drinking household cleaner.  Patient reports that she was cleaning with [place]'s totally awesome all-purpose cleaner mixed with a capful of bleach.  She had the mixture in a cup and also a cup next to it that she was drinking out of.  She reports that she gulped down approximately half a cup before she realized what was going on.  This occurred less than an hour ago.  Patient reports that she immediately vomited and then had an episode of diarrhea.  She is complaining of burning pain in the area of her stomach up through her chest to the throat.       Home Medications Prior to Admission medications   Medication Sig Start Date End Date Taking? Authorizing Provider  albuterol (VENTOLIN HFA) 108 (90 Base) MCG/ACT inhaler Inhale 2 puffs into the lungs every 4 (four) hours as needed. 02/13/23 02/13/24  [provider]  Albuterol-Budesonide (AIRSUPRA) 90-80 MCG/ACT AERO INHALE 2 INHALATIONS INTO THE LUNGS EVERY 4 HOURS AS NEEDED 02/19/23   Kozlow, Alvira Philips, MD  Budeson-Glycopyrrol-Formoterol (BREZTRI AEROSPHERE) 160-9-4.8 MCG/ACT AERO Inhale 2 puffs into the lungs in the morning and at bedtime. 02/18/23   Kozlow, Alvira Philips, MD  busPIRone (BUSPAR) 10 MG tablet Take 1 tablet (10 mg total) by mouth 2 (two) times daily. 08/01/21   Lenard Lance, FNP  cetirizine (ZYRTEC) 10 MG tablet Take 2 tablets (20 mg total) by mouth 2 (two) times daily. 02/18/23   Kozlow, Alvira Philips, MD  EPINEPHrine (EPIPEN 2-PAK) 0.3 mg/0.3 mL IJ SOAJ injection Inject 0.3 mg into the muscle as needed for anaphylaxis. 02/18/23   Kozlow, Alvira Philips, MD  escitalopram (LEXAPRO) 10 MG tablet Take 1 tablet (10 mg total) by mouth  daily. 08/01/21 02/18/23  Lenard Lance, FNP  famotidine (PEPCID) 40 MG tablet Take 1 tablet (40 mg total) by mouth in the morning. 02/18/23   Kozlow, Alvira Philips, MD  hydrOXYzine (ATARAX/VISTARIL) 25 MG tablet Take 1 tablet (25 mg total) by mouth 3 (three) times daily as needed for anxiety. 08/01/21   Lenard Lance, FNP  Olopatadine HCl (PATADAY) 0.2 % SOLN Place 1 drop into both eyes 1 day or 1 dose. 11/05/22   Kozlow, Alvira Philips, MD  Olopatadine-Mometasone (RYALTRIS) [phone] MCG/ACT SUSP PLACE 2 SPRAYS INTO THE NOSE IN THE MORNING AND AT BEDTIME. 02/19/23   Kozlow, Alvira Philips, MD  Spacer/Aero-Holding Deretha Emory DEVI 1 Device by Does not apply route as directed. 02/18/23   Kozlow, Alvira Philips, MD      Allergies    Patient has no known allergies.    Review of Systems   Review of Systems  Physical Exam Updated Vital Signs BP 115/84   Pulse 88   Temp 98.1 F (36.7 C)   Resp (!) 8   SpO2 100%  Physical Exam Vitals and nursing note reviewed.  Constitutional:      General: She is not in acute distress.    Appearance: She is well-developed.  HENT:     Head: Normocephalic and atraumatic.     Mouth/Throat:     Mouth: Mucous membranes are moist.  Eyes:     General: Vision grossly intact.  Gaze aligned appropriately.     Extraocular Movements: Extraocular movements intact.     Conjunctiva/sclera: Conjunctivae normal.  Cardiovascular:     Rate and Rhythm: Normal rate and regular rhythm.     Pulses: Normal pulses.     Heart sounds: Normal heart sounds, S1 normal and S2 normal. No murmur heard.    No friction rub. No gallop.  Pulmonary:     Effort: Pulmonary effort is normal. No respiratory distress.     Breath sounds: Normal breath sounds.  Abdominal:     General: Bowel sounds are normal.     Palpations: Abdomen is soft.     Tenderness: There is no abdominal tenderness. There is no guarding or rebound.     Hernia: No hernia is present.  Musculoskeletal:        General: No swelling.     Cervical back: Full  passive range of motion without pain, normal range of motion and neck supple. No spinous process tenderness or muscular tenderness. Normal range of motion.     Right lower leg: No edema.     Left lower leg: No edema.  Skin:    General: Skin is warm and dry.     Capillary Refill: Capillary refill takes less than 2 seconds.     Findings: No ecchymosis, erythema, rash or wound.  Neurological:     General: No focal deficit present.     Mental Status: She is alert and oriented to person, place, and time.     GCS: GCS eye subscore is 4. GCS verbal subscore is 5. GCS motor subscore is 6.     Cranial Nerves: Cranial nerves 2-12 are intact.     Sensory: Sensation is intact.     Motor: Motor function is intact.     Coordination: Coordination is intact.  Psychiatric:        Attention and Perception: Attention normal.        Mood and Affect: Mood normal.        Speech: Speech normal.        Behavior: Behavior normal.     ED Results / Procedures / Treatments   Labs (all labs ordered are listed, but only abnormal results are displayed) Labs Reviewed  CBC WITH DIFFERENTIAL/PLATELET  COMPREHENSIVE METABOLIC PANEL    EKG None  Radiology No results found.  Procedures Procedures    Medications Ordered in ED Medications  ondansetron (ZOFRAN) injection 4 mg (has no administration in time range)  lactated ringers bolus 1,000 mL (has no administration in time range)    ED Course/ Medical Decision Making/ A&P                             Medical Decision Making Amount and/or Complexity of Data Reviewed Labs: ordered.  Risk OTC drugs. Prescription drug management.   Presents with accidental ingestion of cleaning products.  I did personally speak with poison control.  Based on the products that she swallowed and in their amounts, patient does not require any specific treatment.  She will have GI upset, can be treated symptomatically.  Recommend diluting with water  orally.          Final Clinical Impression(s) / ED Diagnoses Final diagnoses:  Ingestion of bleach, accidental or unintentional, initial encounter    Rx / DC Orders ED Discharge Orders     None         Paul Trettin, Canary Brim, MD 04/23/23 [id number]

## 2023-07-01 ENCOUNTER — Encounter (HOSPITAL_COMMUNITY): Payer: Self-pay

## 2023-07-01 ENCOUNTER — Other Ambulatory Visit: Payer: Self-pay

## 2023-07-01 ENCOUNTER — Emergency Department (HOSPITAL_COMMUNITY)
Admission: EM | Admit: 2023-07-01 | Discharge: 2023-07-02 | Disposition: A | Discharge status: Home / Self Care | Payer: BLUE CROSS/BLUE SHIELD | Attending: Emergency Medicine | Admitting: Emergency Medicine

## 2023-07-01 DIAGNOSIS — N739 Female pelvic inflammatory disease, unspecified: Secondary | ICD-10-CM | POA: Diagnosis not present

## 2023-07-01 DIAGNOSIS — N730 Acute parametritis and pelvic cellulitis: Secondary | ICD-10-CM

## 2023-07-01 DIAGNOSIS — R102 Pelvic and perineal pain: Secondary | ICD-10-CM | POA: Diagnosis present

## 2023-07-01 LAB — COMPREHENSIVE METABOLIC PANEL
ALT: 13 U/L (ref 0–44)
AST: 18 U/L (ref 15–41)
Albumin: 3 g/dL — ABNORMAL LOW (ref 3.5–5.0)
Alkaline Phosphatase: 61 U/L (ref 38–126)
Anion gap: 8 (ref 5–15)
BUN: 8 mg/dL (ref 6–20)
CO2: 22 mmol/L (ref 22–32)
Calcium: 8.2 mg/dL — ABNORMAL LOW (ref 8.9–10.3)
Chloride: 105 mmol/L (ref 98–111)
Creatinine, Ser: 0.9 mg/dL (ref 0.44–1.00)
GFR, Estimated: >60 mL/min (ref >60)
Glucose, Bld: 103 mg/dL — ABNORMAL HIGH (ref 70–99)
Potassium: 3.8 mmol/L (ref 3.5–5.1)
Sodium: 135 mmol/L (ref 135–145)
Total Bilirubin: 0.8 mg/dL (ref 0.3–1.2)
Total Protein: 6.2 g/dL — ABNORMAL LOW (ref 6.5–8.1)

## 2023-07-01 LAB — CBC
HCT: 47.5 % — ABNORMAL HIGH (ref 36.0–46.0)
Hemoglobin: 15 g/dL (ref 12.0–15.0)
MCH: 27.2 pg (ref 26.0–34.0)
MCHC: 31.6 g/dL (ref 30.0–36.0)
MCV: 86.1 fL (ref 80.0–100.0)
Platelets: 302 10*3/uL (ref 150–400)
RBC: 5.52 MIL/uL — ABNORMAL HIGH (ref 3.87–5.11)
RDW: 13.8 % (ref 11.5–15.5)
WBC: 6.6 10*3/uL (ref 4.0–10.5)
nRBC: 0 % (ref 0.0–0.2)

## 2023-07-01 LAB — LIPASE, BLOOD: Lipase: 25 U/L (ref 11–51)

## 2023-07-01 LAB — HIV ANTIBODY (ROUTINE TESTING W REFLEX): HIV Screen 4th Generation wRfx: NONREACTIVE

## 2023-07-01 LAB — HCG, QUANTITATIVE, PREGNANCY: hCG, Beta Chain, Quant, S: 1 m[IU]/mL (ref <5)

## 2023-07-01 NOTE — ED Triage Notes (Addendum)
Pt reports lower abdominal pain and vaginal discomfort, describes it as sharp and intense; onset 3-4 days ago. Denies n/v/d. She reports vaginal bleeding, has Mirena. She is requesting to be tested for STDs as well.

## 2023-07-02 MED ORDER — METRONIDAZOLE 500 MG PO TABS: 500.0000 mg | ORAL_TABLET | Freq: Two times a day (BID) | ORAL | 0 refills | Status: AC

## 2023-07-02 MED ORDER — CEFTRIAXONE SODIUM 500 MG IJ SOLR
500.0000 mg | Freq: Once | INTRAMUSCULAR | Status: AC
  Administered 2023-07-02: 500 mg via INTRAMUSCULAR
  Filled 2023-07-02: qty 500

## 2023-07-02 MED ORDER — LIDOCAINE HCL (PF) 1 % IJ SOLN
INTRAMUSCULAR | Status: AC
  Filled 2023-07-02: qty 30

## 2023-07-02 MED ORDER — IBUPROFEN 400 MG PO TABS
600.0000 mg | ORAL_TABLET | Freq: Once | ORAL | Status: AC
  Administered 2023-07-02: 600 mg via ORAL
  Filled 2023-07-02: qty 1

## 2023-07-02 MED ORDER — DOXYCYCLINE HYCLATE 100 MG PO CAPS: 100.0000 mg | ORAL_CAPSULE | Freq: Two times a day (BID) | ORAL | 0 refills | Status: DC

## 2023-07-02 MED ORDER — METRONIDAZOLE 500 MG PO TABS
500.0000 mg | ORAL_TABLET | Freq: Once | ORAL | Status: AC
  Administered 2023-07-02: 500 mg via ORAL
  Filled 2023-07-02: qty 1

## 2023-07-02 MED ORDER — DOXYCYCLINE HYCLATE 100 MG PO TABS
100.0000 mg | ORAL_TABLET | Freq: Once | ORAL | Status: AC
  Administered 2023-07-02: 100 mg via ORAL
  Filled 2023-07-02: qty 1

## 2023-07-02 NOTE — ED Provider Notes (Signed)
[place] EMERGENCY DEPARTMENT AT [facility] Provider Note   CSN: [id number] Arrival date & time: 07/01/23  2059     History  Chief Complaint  Patient presents with   Abdominal Pain    Katelyn Parker is a 27 y.o. female.  The history is provided by the patient.  Abdominal Pain She has history of Mirena ring insertion and comes in because of suprapubic and vaginal pain for the last 4 days.  She does endorse a weight, malodorous vaginal discharge as well as some slight vaginal bleeding.  Pain is worse with walking but not with cough.  She denies fever, chills, sweats.  She denies nausea, vomiting, diarrhea.  She does admit to unprotected sex.   Home Medications Prior to Admission medications   Medication Sig Start Date End Date Taking? Authorizing Provider  albuterol (VENTOLIN HFA) 108 (90 Base) MCG/ACT inhaler Inhale 2 puffs into the lungs every 4 (four) hours as needed. 02/13/23 02/13/24  [provider]  Albuterol-Budesonide (AIRSUPRA) 90-80 MCG/ACT AERO INHALE 2 INHALATIONS INTO THE LUNGS EVERY 4 HOURS AS NEEDED 02/19/23   Kozlow, Alvira Philips, MD  Budeson-Glycopyrrol-Formoterol (BREZTRI AEROSPHERE) 160-9-4.8 MCG/ACT AERO Inhale 2 puffs into the lungs in the morning and at bedtime. 02/18/23   Kozlow, Alvira Philips, MD  busPIRone (BUSPAR) 10 MG tablet Take 1 tablet (10 mg total) by mouth 2 (two) times daily. 08/01/21   Lenard Lance, FNP  cetirizine (ZYRTEC) 10 MG tablet Take 2 tablets (20 mg total) by mouth 2 (two) times daily. 02/18/23   Kozlow, Alvira Philips, MD  EPINEPHrine (EPIPEN 2-PAK) 0.3 mg/0.3 mL IJ SOAJ injection Inject 0.3 mg into the muscle as needed for anaphylaxis. 02/18/23   Kozlow, Alvira Philips, MD  escitalopram (LEXAPRO) 10 MG tablet Take 1 tablet (10 mg total) by mouth daily. 08/01/21 02/18/23  Lenard Lance, FNP  famotidine (PEPCID) 40 MG tablet Take 1 tablet (40 mg total) by mouth in the morning. 02/18/23   Kozlow, Alvira Philips, MD  hydrOXYzine (ATARAX/VISTARIL) 25 MG tablet Take 1  tablet (25 mg total) by mouth 3 (three) times daily as needed for anxiety. 08/01/21   Lenard Lance, FNP  Olopatadine HCl (PATADAY) 0.2 % SOLN Place 1 drop into both eyes 1 day or 1 dose. 11/05/22   Kozlow, Alvira Philips, MD  Olopatadine-Mometasone (RYALTRIS) [phone] MCG/ACT SUSP PLACE 2 SPRAYS INTO THE NOSE IN THE MORNING AND AT BEDTIME. 02/19/23   Kozlow, Alvira Philips, MD  Spacer/Aero-Holding Deretha Emory DEVI 1 Device by Does not apply route as directed. 02/18/23   Kozlow, Alvira Philips, MD      Allergies    Patient has no known allergies.    Review of Systems   Review of Systems  Gastrointestinal:  Positive for abdominal pain.  All other systems reviewed and are negative.   Physical Exam Updated Vital Signs BP (!) 140/94   Pulse 72   Temp 97.7 F (36.5 C)   Resp 16   Ht 5\' 3"  (1.6 m)   Wt 129.3 kg   SpO2 97%   BMI 50.49 kg/m  Physical Exam Vitals and nursing note reviewed.   27 year old female, resting comfortably and in no acute distress. Vital signs are significant for borderline elevated blood pressure. Oxygen saturation is 97%, which is normal. Head is normocephalic and atraumatic. PERRLA, EOMI. Oropharynx is clear. Neck is nontender and supple without adenopathy. Back is nontender and there is no CVA tenderness. Lungs are clear without rales, wheezes, or rhonchi.  Chest is nontender. Heart has regular rate and rhythm without murmur. Abdomen is soft, flat, with mild suprapubic tenderness.  There is no rebound or guarding. Pelvic: Normal external female genitalia.  Small amount of brownish vaginal discharge present.  Cervix is closed with IUD string visible.  Moderate bilateral adnexal tenderness and cervical motion tenderness.  No definite masses detected, but exam is limited by body habitus. Extremities have no cyanosis or edema, full range of motion is present. Skin is warm and dry without rash. Neurologic: Mental status is normal, cranial nerves are intact, moves all extremities equally.  ED  Results / Procedures / Treatments   Labs (all labs ordered are listed, but only abnormal results are displayed) Labs Reviewed  CBC - Abnormal; Notable for the following components:      Result Value   RBC 5.52 (*)    HCT 47.5 (*)    All other components within normal limits  COMPREHENSIVE METABOLIC PANEL - Abnormal; Notable for the following components:   Glucose, Bld 103 (*)    Calcium 8.2 (*)    Total Protein 6.2 (*)    Albumin 3.0 (*)    All other components within normal limits  URINALYSIS, ROUTINE W REFLEX MICROSCOPIC - Abnormal; Notable for the following components:   Hgb urine dipstick MODERATE (*)    Protein, ur 30 (*)    Leukocytes,Ua MODERATE (*)    Bacteria, UA RARE (*)    All other components within normal limits  WET PREP, GENITAL  LIPASE, BLOOD  HIV ANTIBODY (ROUTINE TESTING W REFLEX)  HCG, QUANTITATIVE, PREGNANCY  RPR  GC/CHLAMYDIA PROBE AMP () NOT AT [facility]   Procedures Procedures    Medications Ordered in ED Medications  lidocaine (PF) (XYLOCAINE) 1 % injection (has no administration in time range)  cefTRIAXone (ROCEPHIN) injection 500 mg (500 mg Intramuscular Given 07/02/23 0443)  doxycycline (VIBRA-TABS) tablet 100 mg (100 mg Oral Given 07/02/23 0446)  metroNIDAZOLE (FLAGYL) tablet 500 mg (500 mg Oral Given 07/02/23 0446)  ibuprofen (ADVIL) tablet 600 mg (600 mg Oral Given 07/02/23 0446)    ED Course/ Medical Decision Making/ A&P                                 Medical Decision Making Amount and/or Complexity of Data Reviewed Labs: ordered.  Risk Prescription drug management.   Pelvic pain concerning for PID.  Differential diagnosis includes urinary tract infection, diverticulitis, ovarian cyst.  I have reviewed and interpreted her laboratory tests and my interpretation is borderline elevated random glucose level, chronic hypoalbuminemia, normal CBC, negative HIV screen, urinalysis with mild pyuria-21-50 WBCs per high-power field as well as  mild proteinuria.  I have reviewed her past records, and she was treated for gonorrhea on 05/27/2022, treated for cervicitis on 04/27/2017.  Wet prep is negative for yeast, trichomonas, clue cells.  I have ordered intramuscular ceftriaxone, oral doxycycline and metronidazole.  I am discharging the patient with prescriptions for 2-week course of doxycycline and metronidazole.  I have advised her to use over-the-counter NSAIDs and acetaminophen as needed for pain.  Importance of follow-up with gynecology has been stressed.  Return precautions have been discussed.  Final Clinical Impression(s) / ED Diagnoses Final diagnoses:  PID (acute pelvic inflammatory disease)    Rx / DC Orders ED Discharge Orders          Ordered    doxycycline (VIBRAMYCIN) 100 MG capsule  2 times  daily        07/02/23 0509    metroNIDAZOLE (FLAGYL) 500 MG tablet  2 times daily        07/02/23 0509              Dione Booze, MD 07/02/23 [phone]

## 2023-07-02 NOTE — Discharge Instructions (Addendum)
You may take ibuprofen and/or acetaminophen as needed for pain.  Do not drink any alcohol whatsoever while you are taking metronidazole.  If you do, you will get deathly sick.  Return if you have any new or concerning symptoms.  It is very important to follow-up with the women's clinic to make sure that the infection has been adequately treated.

## 2023-08-19 ENCOUNTER — Encounter: Payer: BLUE CROSS/BLUE SHIELD | Admitting: Obstetrics and Gynecology

## 2023-09-28 ENCOUNTER — Emergency Department (HOSPITAL_COMMUNITY)
Admission: EM | Admit: 2023-09-28 | Discharge: 2023-09-28 | Disposition: A | Discharge status: Home / Self Care | Payer: BLUE CROSS/BLUE SHIELD | Attending: Emergency Medicine | Admitting: Emergency Medicine

## 2023-09-28 ENCOUNTER — Other Ambulatory Visit: Payer: Self-pay

## 2023-09-28 ENCOUNTER — Encounter (HOSPITAL_COMMUNITY): Payer: Self-pay

## 2023-09-28 DIAGNOSIS — L0231 Cutaneous abscess of buttock: Secondary | ICD-10-CM | POA: Insufficient documentation

## 2023-09-28 MED ORDER — DOXYCYCLINE HYCLATE 100 MG PO TABS: 100.0000 mg | ORAL_TABLET | Freq: Once | ORAL | Status: DC

## 2023-09-28 MED ORDER — SULFAMETHOXAZOLE-TRIMETHOPRIM 800-160 MG PO TABS
1.0000 | ORAL_TABLET | Freq: Once | ORAL | Status: AC
  Administered 2023-09-28: 1 via ORAL
  Filled 2023-09-28: qty 1

## 2023-09-28 MED ORDER — SULFAMETHOXAZOLE-TRIMETHOPRIM 800-160 MG PO TABS: 1.0000 | ORAL_TABLET | Freq: Two times a day (BID) | ORAL | 0 refills | Status: AC

## 2023-09-28 MED ORDER — DOXYCYCLINE HYCLATE 100 MG PO CAPS: 100.0000 mg | ORAL_CAPSULE | Freq: Two times a day (BID) | ORAL | 0 refills | Status: DC

## 2023-09-28 MED ORDER — ACETAMINOPHEN 500 MG PO TABS
1000.0000 mg | ORAL_TABLET | Freq: Once | ORAL | Status: AC
  Administered 2023-09-28: 1000 mg via ORAL
  Filled 2023-09-28: qty 2

## 2023-09-28 NOTE — ED Triage Notes (Signed)
Pt BIB [facility] d/t abscess on right buttocks the size of a baseball.  Pt states she cannot get it to drain.  Pain and numbness radiates down from the abscess down the right leg.  EMS gave 650 mg PO Tylenol for pain.

## 2023-09-28 NOTE — ED Provider Notes (Addendum)
[place] EMERGENCY DEPARTMENT AT [facility] Provider Note   CSN: [id number] Arrival date & time: 09/28/23  0503     History  Chief Complaint  Patient presents with   Abscess    Katelyn Parker is a 27 y.o. female with asthma who presents the emergency department complaining of an abscess on her right buttocks.  Patient states that she has had several of these before, and several years ago needed to be admitted for IV antibiotics.  At that time she had had an abscess that was adjacent to her anus.  She has had this abscess on her right buttocks for about a week now.  She states that she has been trying to get it to drain, and only open while she was waiting in the waiting room.  She is having some swelling of the area and some intermittent numbness/pain in the thigh.  She was given Tylenol by EMS for pain.   Abscess      Home Medications Prior to Admission medications   Medication Sig Start Date End Date Taking? Authorizing Provider  doxycycline (VIBRAMYCIN) 100 MG capsule Take 1 capsule (100 mg total) by mouth 2 (two) times daily. 09/28/23  Yes Braylee Lal T, PA-C  albuterol (VENTOLIN HFA) 108 (90 Base) MCG/ACT inhaler Inhale 2 puffs into the lungs every 4 (four) hours as needed. 02/13/23 02/13/24  [provider]  Albuterol-Budesonide (AIRSUPRA) 90-80 MCG/ACT AERO INHALE 2 INHALATIONS INTO THE LUNGS EVERY 4 HOURS AS NEEDED 02/19/23   Kozlow, Alvira Philips, MD  Budeson-Glycopyrrol-Formoterol (BREZTRI AEROSPHERE) 160-9-4.8 MCG/ACT AERO Inhale 2 puffs into the lungs in the morning and at bedtime. 02/18/23   Kozlow, Alvira Philips, MD  busPIRone (BUSPAR) 10 MG tablet Take 1 tablet (10 mg total) by mouth 2 (two) times daily. 08/01/21   Lenard Lance, FNP  cetirizine (ZYRTEC) 10 MG tablet Take 2 tablets (20 mg total) by mouth 2 (two) times daily. 02/18/23   Kozlow, Alvira Philips, MD  EPINEPHrine (EPIPEN 2-PAK) 0.3 mg/0.3 mL IJ SOAJ injection Inject 0.3 mg into the muscle as needed for  anaphylaxis. 02/18/23   Kozlow, Alvira Philips, MD  escitalopram (LEXAPRO) 10 MG tablet Take 1 tablet (10 mg total) by mouth daily. 08/01/21 02/18/23  Lenard Lance, FNP  famotidine (PEPCID) 40 MG tablet Take 1 tablet (40 mg total) by mouth in the morning. 02/18/23   Kozlow, Alvira Philips, MD  hydrOXYzine (ATARAX/VISTARIL) 25 MG tablet Take 1 tablet (25 mg total) by mouth 3 (three) times daily as needed for anxiety. 08/01/21   Lenard Lance, FNP  metroNIDAZOLE (FLAGYL) 500 MG tablet Take 1 tablet (500 mg total) by mouth 2 (two) times daily. 07/02/23   Dione Booze, MD  Olopatadine HCl (PATADAY) 0.2 % SOLN Place 1 drop into both eyes 1 day or 1 dose. 11/05/22   Kozlow, Alvira Philips, MD  Olopatadine-Mometasone (RYALTRIS) [phone] MCG/ACT SUSP PLACE 2 SPRAYS INTO THE NOSE IN THE MORNING AND AT BEDTIME. 02/19/23   Kozlow, Alvira Philips, MD  Spacer/Aero-Holding Deretha Emory DEVI 1 Device by Does not apply route as directed. 02/18/23   Kozlow, Alvira Philips, MD      Allergies    Patient has no known allergies.    Review of Systems   Review of Systems  Skin:        Abscess  All other systems reviewed and are negative.   Physical Exam Updated Vital Signs BP 132/85   Pulse 82   Temp 97.6 F (36.4 C) (Oral)  Resp 10   Ht 5\' 4"  (1.626 m)   Wt 124.7 kg   LMP  (LMP Unknown)   SpO2 100%   BMI 47.20 kg/m  Physical Exam Vitals and nursing note reviewed.  Constitutional:      Appearance: Normal appearance.  HENT:     Head: Normocephalic and atraumatic.  Eyes:     Conjunctiva/sclera: Conjunctivae normal.  Pulmonary:     Effort: Pulmonary effort is normal. No respiratory distress.  Skin:    General: Skin is warm and dry.          Comments: About 1 cm area of open skin with serous and purulent drainage, surrounding area of fluctuance about 4 cm in diameter, area of induration about 6 cm  Neurological:     Mental Status: She is alert.  Psychiatric:        Mood and Affect: Mood normal.        Behavior: Behavior normal.     ED  Results / Procedures / Treatments   Labs (all labs ordered are listed, but only abnormal results are displayed) Labs Reviewed - No data to display  EKG None  Radiology No results found.  Procedures Procedures    Medications Ordered in ED Medications  acetaminophen (TYLENOL) tablet 1,000 mg (has no administration in time range)    ED Course/ Medical Decision Making/ A&P                                 Medical Decision Making Risk OTC drugs. Prescription drug management.   Patient is a 27 y.o. female who presents to the emergency department for an abscess.  Physical exam: Open abscess to right buttocks, surrounding fluctuance and induration  Procedure: Abscess was drained with manual pressure, no incision or numbing required. Area was cleaned with shur-clens and bandaged appropriately. Not requiring packing. Patient tolerated procedure well with no immediate complications.   Medications: Given tylenol for pain and initial dose of antibiotic  Disposition: Patient is not requiring admission or inpatient treatment further symptoms.  Patient was placed on course of antibiotics and instructed to have a wound check in 3 days.  We discussed reasons to return to the emergency department, and patient is agreeable to the plan.  Final Clinical Impression(s) / ED Diagnoses Final diagnoses:  Abscess of buttock, right    Rx / DC Orders ED Discharge Orders          Ordered    doxycycline (VIBRAMYCIN) 100 MG capsule  2 times daily        09/28/23 0935           Portions of this report may have been transcribed using voice recognition software. Every effort was made to ensure accuracy; however, inadvertent computerized transcription errors may be present.    Jeanella Flattery 09/28/23 8119  Addendum -- pt requested different antibiotic because doxycycline "makes her sick". Changed to bactrim, given initial dose in ER per patient request.   Su Monks,  PA-C 09/28/23 1478    Horton, Clabe Seal, DO 09/28/23 1600

## 2023-09-28 NOTE — Discharge Instructions (Addendum)
You were seen in the emergency department for an abscess.  We have drained the area and cleaned it. I would like you to have the wound checked in 2-3 days. This can be done by any doctor's office, urgent care, or emergency department. This is to make sure the area hasn't closed too soon. Try to keep the area as clean and dry as possible. It is okay to let warm soapy water run over the area, but do NOT scrub the area.   I am placing you on a course of antibiotics. It is important you finish the entire course! You can take ibuprofen or tylenol as needed for pain.   You can use an antiseptic (chlorhexidine) soap from the pharmacy 1-2 x per month in the areas where abscesses are most likely to form (armpits, buttocks, groin). This soap can dry your skin out so use it sparingly. Once do so once this area has fully healed.   Continue to monitor how you're doing and return to the ER for new or worsening symptoms.

## 2024-06-17 ENCOUNTER — Other Ambulatory Visit: Payer: Self-pay | Admitting: Allergy and Immunology

## 2024-07-01 ENCOUNTER — Encounter: Admitting: Physician Assistant

## 2024-07-01 NOTE — Progress Notes (Deleted)
 GYNECOLOGY  VISIT   HPI: Katelyn Parker is a 28 y.o.   Single  {Race/ethnicity:17218}  female  [id number] here for ***    GYNECOLOGIC HISTORY: No LMP recorded. (Menstrual status: IUD). Contraception: {contraception:315051}.   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  No past surgical history on file.  Current Outpatient Medications  Medication Sig Dispense Refill   albuterol  (VENTOLIN  HFA) 108 (90 Base) MCG/ACT inhaler Inhale 2 puffs into the lungs every 4 (four) hours as needed.     Albuterol -Budesonide (AIRSUPRA ) 90-80 MCG/ACT AERO INHALE 2 INHALATIONS INTO THE LUNGS EVERY 4 HOURS AS NEEDED 10.7 g 1   Budeson-Glycopyrrol-Formoterol (BREZTRI  AEROSPHERE) 160-9-4.8 MCG/ACT AERO Inhale 2 puffs into the lungs in the morning and at bedtime. 32.1 g 1   busPIRone  (BUSPAR ) 10 MG tablet Take 1 tablet (10 mg total) by mouth 2 (two) times daily. 60 tablet 0   cetirizine  (ZYRTEC ) 10 MG tablet Take 2 tablets (20 mg total) by mouth 2 (two) times daily. 360 tablet 1   EPINEPHrine  (EPIPEN  2-PAK) 0.3 mg/0.3 mL IJ SOAJ injection Inject 0.3 mg into the muscle as needed for anaphylaxis. 2 each 1   escitalopram  (LEXAPRO )  10 MG tablet Take 1 tablet (10 mg total) by mouth daily. 30 tablet 1   famotidine  (PEPCID ) 40 MG tablet Take 1 tablet (40 mg total) by mouth in the morning. 90 tablet 1   hydrOXYzine  (ATARAX /VISTARIL ) 25 MG tablet Take 1 tablet (25 mg total) by mouth 3 (three) times daily as needed for anxiety. 30 tablet 0   metroNIDAZOLE  (FLAGYL ) 500 MG tablet Take 1 tablet (500 mg total) by mouth 2 (two) times daily. 28 tablet 0   Olopatadine  HCl (PATADAY ) 0.2 % SOLN Place 1 drop into both eyes 1 day or 1 dose. 2.5 mL 5   Olopatadine -Mometasone (RYALTRIS ) 665-25 MCG/ACT SUSP PLACE 2 SPRAYS INTO THE NOSE IN THE MORNING AND AT BEDTIME. 29 g 0   Spacer/Aero-Holding Chambers DEVI 1 Device by Does not apply route as directed. 1 each 2   Current Facility-Administered Medications  Medication Dose Route Frequency Provider Last Rate Last Admin   omalizumab  (XOLAIR ) prefilled syringe 300 mg  300 mg Subcutaneous Q28 days Kozlow, Eric J, MD   300 mg at 02/18/23 1607     ALLERGIES: Doxycycline   Family History  Problem Relation Age of Onset   Allergic rhinitis Mother    Asthma Sister  Social History   Socioeconomic History   Marital status: Single    Spouse name: Not on file   Number of children: Not on file   Years of education: Not on file   Highest education level: Not on file  Occupational History   Not on file  Tobacco Use   Smoking status: Never   Smokeless tobacco: Never  Vaping Use   Vaping status: Former   Substances: Nicotine, Flavoring  Substance and Sexual Activity   Alcohol use: Yes   Drug use: Not Currently   Sexual activity: Yes  Other Topics Concern   Not on file  Social History Narrative   Not on file   Social Drivers of Health   Financial Resource Strain: Not on file  Food Insecurity: Low Risk  (06/29/2024)   Received from [facility]   Hunger Vital Sign    Within the past 12 months, you worried that your food would run out before you got money to buy more: Never true     Within the past 12 months, the food you bought just didn't last and you didn't have money to get more. : Never true  Transportation Needs: No Transportation Needs (06/29/2024)   Received from [facility]    In the past 12 months, has lack of reliable transportation kept you from medical appointments, meetings, work or from getting things needed for daily living? : No  Physical Activity: Not on file  Stress: Not on file  Social Connections: Unknown (04/16/2022)   Received from [facility]   Social Network    Social Network: Not on file  Intimate Partner Violence: Unknown (03/08/2022)   Received from [facility]   HITS    Physically Hurt: Not on file    Insult or Talk Down To: Not on file    Threaten Physical Harm: Not on file    Scream or Curse: Not on file    Review of Systems  PHYSICAL EXAMINATION:    There were no vitals taken for this visit.    General appearance: alert, cooperative and appears stated age Head: Normocephalic, without obvious abnormality, atraumatic Neck: no adenopathy, supple, symmetrical, trachea midline and thyroid  normal to inspection and palpation Lungs: clear to auscultation bilaterally Breasts: normal appearance, no masses or tenderness, No nipple retraction or dimpling, No nipple discharge or bleeding, No axillary or supraclavicular adenopathy Heart: regular rate and rhythm Abdomen: soft, non-tender, no masses,  no organomegaly Extremities: extremities normal, atraumatic, no cyanosis or edema Skin: Skin color, texture, turgor normal. No rashes or lesions Lymph nodes: Cervical, supraclavicular, and axillary nodes normal. No abnormal inguinal nodes palpated Neurologic: Grossly normal  Pelvic: External genitalia:  no lesions              Urethra:  normal appearing urethra with no masses, tenderness or lesions              Bartholins and Skenes: normal                 Vagina: normal appearing vagina with normal color and discharge, no  lesions              Cervix: no lesions                Bimanual Exam:  Uterus:  normal size, contour, position, consistency, mobility, non-tender              Adnexa: no mass, fullness, tenderness  Rectal exam: {yes no:314532}.  Confirms.              Anus:  normal sphincter tone, no lesions  Chaperone was present for exam  ASSESSMENT & PLAN  There are no diagnoses linked to this encounter.      An After Visit Summary was printed and given to the patient.   Ajna Moors E Miana Politte, PA-C 7/31/20257:13 AM

## 2024-08-10 ENCOUNTER — Emergency Department (HOSPITAL_COMMUNITY)
Admission: EM | Admit: 2024-08-10 | Discharge: 2024-08-10 | Discharge status: Against Medical Advice | Attending: Emergency Medicine | Admitting: Emergency Medicine

## 2024-08-10 ENCOUNTER — Other Ambulatory Visit: Payer: Self-pay

## 2024-08-10 ENCOUNTER — Emergency Department (HOSPITAL_COMMUNITY)

## 2024-08-10 ENCOUNTER — Encounter (HOSPITAL_COMMUNITY): Payer: Self-pay | Admitting: Emergency Medicine

## 2024-08-10 DIAGNOSIS — Z5321 Procedure and treatment not carried out due to patient leaving prior to being seen by health care provider: Secondary | ICD-10-CM | POA: Diagnosis not present

## 2024-08-10 DIAGNOSIS — R079 Chest pain, unspecified: Secondary | ICD-10-CM | POA: Insufficient documentation

## 2024-08-10 DIAGNOSIS — J029 Acute pharyngitis, unspecified: Secondary | ICD-10-CM | POA: Diagnosis present

## 2024-08-10 NOTE — ED Notes (Signed)
 Pt called to assigned room from the ED lobby with no respond. Pt Eloped after been triage, EKG an x- ray done.

## 2024-08-10 NOTE — ED Triage Notes (Signed)
 Pt arrive POV from home for c/o sore throat and cp. States yesterday she had a plastic on her mouth and chock on it by accident around 23:00 last night since then she is having soreness on her throat, cp and SOB. Pt is able to talk on complete sentences, NAD noticed during triage.

## 2024-10-22 ENCOUNTER — Encounter: Admitting: Physician Assistant

## 2025-01-12 ENCOUNTER — Ambulatory Visit: Payer: Self-pay | Admitting: Obstetrics and Gynecology
